# Patient Record
Sex: Male | Born: 1965 | Race: White | Marital: Married | Smoking: Never smoker
Health system: Northeastern US, Academic
[De-identification: ages and names within clinical notes are randomized; demographics above are authoritative.]

## PROBLEM LIST (undated history)

## (undated) DIAGNOSIS — F1910 Other psychoactive substance abuse, uncomplicated: Secondary | ICD-10-CM

## (undated) DIAGNOSIS — I10 Essential (primary) hypertension: Secondary | ICD-10-CM

## (undated) DIAGNOSIS — F32A Depression, unspecified: Secondary | ICD-10-CM

## (undated) DIAGNOSIS — F419 Anxiety disorder, unspecified: Secondary | ICD-10-CM

## (undated) HISTORY — DX: Anxiety disorder, unspecified: F41.9

## (undated) HISTORY — DX: Other psychoactive substance abuse, uncomplicated: F19.10

## (undated) HISTORY — DX: Depression, unspecified: F32.A

## (undated) HISTORY — DX: Essential (primary) hypertension: I10

## (undated) SURGERY — REPAIR, HERNIA, UMBILICAL
Anesthesia: General | Site: Abdomen

---
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
## 2006-10-02 DIAGNOSIS — F411 Generalized anxiety disorder: Secondary | ICD-10-CM | POA: Insufficient documentation

## 2006-10-02 DIAGNOSIS — F3289 Other specified depressive episodes: Secondary | ICD-10-CM | POA: Insufficient documentation

## 2006-10-02 DIAGNOSIS — I10 Essential (primary) hypertension: Secondary | ICD-10-CM | POA: Insufficient documentation

## 2006-10-02 DIAGNOSIS — E785 Hyperlipidemia, unspecified: Secondary | ICD-10-CM | POA: Insufficient documentation

## 2006-11-08 DIAGNOSIS — F909 Attention-deficit hyperactivity disorder, unspecified type: Secondary | ICD-10-CM | POA: Insufficient documentation

## 2006-11-08 DIAGNOSIS — H6690 Otitis media, unspecified, unspecified ear: Secondary | ICD-10-CM | POA: Insufficient documentation

## 2006-11-08 DIAGNOSIS — B079 Viral wart, unspecified: Secondary | ICD-10-CM | POA: Insufficient documentation

## 2009-08-17 ENCOUNTER — Ambulatory Visit
Admit: 2009-08-17 | Discharge: 2009-08-17 | Disposition: A | Discharge status: Home / Self Care | Payer: Self-pay | Source: Ambulatory Visit | Attending: Primary Care | Admitting: Primary Care

## 2009-08-17 LAB — CBC AND DIFFERENTIAL
Baso # K/uL: 0 THOU/uL (ref 0.0–0.1)
Basophil %: 0.6 % (ref 0.2–1.2)
Eos # K/uL: 0.6 THOU/uL — ABNORMAL HIGH (ref 0.0–0.5)
Eosinophil %: 9 % — ABNORMAL HIGH (ref 0.8–7.0)
Hematocrit: 45 % (ref 40–51)
Hemoglobin: 15.1 g/dL (ref 13.7–17.5)
Lymph # K/uL: 2.2 THOU/uL (ref 1.3–3.6)
Lymphocyte %: 33 % (ref 21.8–53.1)
MCV: 91 fL (ref 79–92)
Mono # K/uL: 0.6 THOU/uL (ref 0.3–0.8)
Monocyte %: 9 % (ref 5.3–12.2)
Neut # K/uL: 3.2 THOU/uL (ref 1.8–5.4)
Platelets: 268 THOU/uL (ref 150–330)
RBC: 5 MIL/uL (ref 4.6–6.1)
RDW: 12.5 % (ref 11.6–14.4)
Seg Neut %: 48.4 % (ref 34.0–67.9)
WBC: 6.7 THOU/uL (ref 4.2–9.1)

## 2009-08-17 LAB — LIPID PANEL
Chol/HDL Ratio: 5.1
Cholesterol: 221 mg/dL — AB
HDL: 43 mg/dL
LDL Calculated: 114 mg/dL
Non HDL Cholesterol: 178 mg/dL
Triglycerides: 319 mg/dL — AB

## 2009-08-17 LAB — BASIC METABOLIC PANEL
Anion Gap: 10 (ref 7–16)
CO2: 27 mmol/L (ref 20–28)
Calcium: 9 mg/dL (ref 9.0–10.3)
Chloride: 106 mmol/L (ref 96–108)
Creatinine: 1.21 mg/dL — ABNORMAL HIGH (ref 0.67–1.17)
GFR,Black: >59 *
GFR,Caucasian: >59 *
Glucose: 107 mg/dL — ABNORMAL HIGH (ref 74–106)
Lab: 18 mg/dL (ref 6–20)
Potassium: 4.1 mmol/L (ref 3.3–5.1)
Sodium: 143 mmol/L (ref 133–145)

## 2009-08-17 LAB — TSH: TSH: 1.83 u[IU]/mL (ref 0.27–4.20)

## 2009-08-18 ENCOUNTER — Encounter: Payer: Self-pay | Admitting: Primary Care

## 2009-09-05 ENCOUNTER — Ambulatory Visit: Payer: Self-pay | Admitting: Primary Care

## 2009-09-28 ENCOUNTER — Encounter: Payer: Self-pay | Admitting: Orthopedic Surgery

## 2009-10-19 ENCOUNTER — Ambulatory Visit: Payer: Self-pay | Admitting: Primary Care

## 2009-10-21 ENCOUNTER — Encounter: Payer: Self-pay | Admitting: Orthopedic Surgery

## 2009-11-03 ENCOUNTER — Ambulatory Visit: Payer: Self-pay | Admitting: Orthopedic Surgery

## 2009-11-07 NOTE — Progress Notes (Signed)
 HISTORY OF PRESENTING ILLNESS: He is now 44 years old and presents for  orthopaedic consultation for a problem with his left knee.  In August of  2010 he was running to first base playing softball.  He hyperextended his  left knee.  He thinks he felt a pop in the knee at that time.  He developed  an immediate effusion.  He iced it and was originally seen by a Scientist, water quality.  Radiographs were taken.  He was recommended to ice and rest.  Symptoms continued and now he presents for my consultation.    PAST MEDICAL HISTORY: Is otherwise significant for his right knee  osteoarthritis and a foot fracture.    PAST SURGICAL HISTORY: Negative.    MEDICATIONS:  He takes no medications.    ALLERGIES:   He has no known drug allergies.    REVIEW OF SYSTEMS: Otherwise negative.    PHYSICAL EXAMINATION: On physical examination of his left knee he has full  range of motion of his left knee.  He has no effusion. With range of motion  he does get a superior lateral popping in the knee.  It does appear to be  soft tissue mediated.   He has no tenderness over the quad tendon, patella,  patellar tendon, or tibial tuberosity. He has no medial or lateral patellar  facet tenderness.  He has no medial or lateral joint line tenderness. The  knee is stable to varus and valgus stress with a negative Lachman  examination and negative posterior drawer.  On circumduction maneuvers he  does have medial pain.  No lateral symptoms with circumduction.    RADIOGRAPHS: Review of his nonstanding AP and lateral radiographs dated  05/13/2009 are within normal limits with the exception of some mild  degenerative disease.    IMPRESSION: Possible meniscus tear of the left knee.    PLAN: I am going to send him for an MRI examination for further evaluation  for meniscal pathology.  I will see him back shortly following that study.                  Electronically Signed and Finalized  by  Lucy Antigua, MD  11/07/2009  10:40  ___________________________________________  Lucy Antigua, MD      DD:   11/03/2009  DT:   11/04/2009  6:07 A  HYQ/[id number]  [id number]    cc:   Ellsworth Lennox, MD

## 2009-11-09 ENCOUNTER — Ambulatory Visit: Payer: Self-pay | Admitting: Primary Care

## 2009-11-29 ENCOUNTER — Ambulatory Visit: Payer: Self-pay | Admitting: Primary Care

## 2009-12-27 ENCOUNTER — Ambulatory Visit: Payer: Self-pay | Admitting: Primary Care

## 2010-01-26 ENCOUNTER — Ambulatory Visit: Payer: Self-pay | Admitting: Primary Care

## 2010-02-02 ENCOUNTER — Encounter: Payer: Self-pay | Admitting: Gastroenterology

## 2010-02-02 NOTE — Progress Notes (Signed)
Reason For Visit   .  CHIEF COMPLAINT: Elevated cholesterol  HPI: In terms of elevated cholesterol for chest pain or shortness of   breath. No number of years. Moderate severity. Not getting better. High   blood pressure not losing significant weight. Increase glucose and the   patient does not check his blood sugars. Never took the metoprolol.   Drinking close to on a daily basis.    Medications reviewed and reconciled in the computer today.        PAST MEDICAL HISTORY: Anxiety        FAMILY HISTORY: Hypertension        SOCIAL HISTORY: Married, working, does not smoke does drink does not do   drugs     REVIEW OF SYSTEMS: No significant new GI symptoms, no significant   constitutional symptoms such as change in weight.     PHYSICAL EXAM: Vitals: As recorded  S1-S2, regular rhythm, no S3, no rub,   Lungs clear.  No masses,   tenderness, organomegaly, or bruits on abdominal exam.   no calf tenderness   or cords.  No edema.           ASSESS: Elevated cholesterol  Hypertension  Increased glucose        PLAN: Hold on metoprolol  [facility] for his alcohol     Follow-up later this year.  Vital Signs   Recorded by Hooper,Theresa on 26 Jan 2010 04:05 PM  BP:120/84,  LUE,  Sitting,   HR: 68 b/min,  L Radial, Regular,   Weight: 215.4 lb.  Signature   Electronically signed by: Ellsworth Lennox  M.D.; 02/02/2010 7:34 AM EST.

## 2010-02-23 ENCOUNTER — Ambulatory Visit: Payer: Self-pay

## 2010-03-28 ENCOUNTER — Ambulatory Visit: Payer: Self-pay | Admitting: Primary Care

## 2010-03-29 NOTE — Progress Notes (Signed)
Reason For Visit   .  CHIEF COMPLAINT: Sinusitis pharyngitis  HPI: In terms of sinusitis pharyngitis for about a week. Moderate severity.   Not getting better. Discolored drainage. No fever or chills.     Medications reviewed and reconciled in the computer today.        PAST MEDICAL HISTORY: Anxiety     REVIEW OF SYSTEMS:     No     constitutional symptoms such as fatigue.           PHYSICAL EXAM: Vitals: As recorded  S1-S2, regular rhythm, no S3, no rub,   Lungs clear.  No masses,   tenderness, organomegaly, or bruits on abdominal exam.  No calf tenderness   or cords.  No edema. Decreased sinus illumination for 1+ drainage. Throat not red. TMs   just a small amount of fluid              ASSESS: Sinusitis  Pharyngitis        PLAN: Flonase 2 q.d.  Zyrtec p.r.n.  Call if not better  Call if worsens     Follow-up: This year.  Vital Signs   Recorded by Hooper,Theresa on 28 Mar 2010 03:33 PM  BP:122/86,  LUE,  Sitting,   HR: 88 b/min,  L Radial, Regular,   Temp: 98.4 F,   Weight: 213.0 lb.  Signature   Electronically signed by: Ellsworth Lennox  M.D.; 03/29/2010 7:39 AM EST.

## 2010-05-04 ENCOUNTER — Ambulatory Visit: Payer: Self-pay | Admitting: Primary Care

## 2010-06-09 ENCOUNTER — Ambulatory Visit: Payer: Self-pay | Admitting: Primary Care

## 2010-07-20 ENCOUNTER — Ambulatory Visit: Payer: Self-pay | Admitting: Primary Care

## 2010-08-01 ENCOUNTER — Ambulatory Visit: Payer: Self-pay | Admitting: Primary Care

## 2010-08-01 NOTE — Progress Notes (Signed)
 Reason For Visit   .  CHIEF COMPLAINT: Arthritis     HPI: In terms of arthritis of his knee on previous x-rays. Knee pain and   swelling on occasion. No rebound through with alcohol counseling. Except   for one visit. Psychologically doing okay.    Medications reviewed and reconciled in the computer today.        PAST MEDICAL HISTORY: Hypertension elevated cholesterol        FAMILY HISTORY: Hypertension        SOCIAL HISTORY: Married, working, does not smoke but does drink but does   not do drugs        REVIEW OF SYSTEMS: No significant new GI symptoms, no significant   constitutional symptoms such as change in weight.     PHYSICAL EXAM: Vitals: As recorded  S1-S2, regular rhythm, no S3, no rub,   Lungs clear.  No masses,   tenderness, organomegaly, or bruits on abdominal exam.   no calf tenderness   or cords.  No edema. Right knee with tenderness and some instability           ASSESS: Knee pain  Arthritis        PLAN: Orthopedics  AA  Diet weight loss and exercise  X-ray  Check blood work  Call after blood work        Follow-up next year.  Vital Signs   Recorded by Hooper,Theresa on 01 Aug 2010 04:02 PM  BP:124/86,  LUE,  Sitting,   HR: 60 b/min,  L Radial, Regular,   Weight: 209 lb.  Signature   Electronically signed by: Ellsworth Lennox  M.D.; 08/01/2010 5:28 PM EST.

## 2010-10-10 NOTE — Miscellaneous (Unsigned)
 Continuity of Care Record  Created: todo  From: SUOZZI, ANTHONY  From:   From: [employer], EHR v10.2.7.53  To: Philis Kendall  Purpose: Patient Use;       Problems  Diagnosis: Anxiety (300.00)   Diagnosis: Attention Deficit Disorder Without Hyperactivity (314.01)   Diagnosis: Depression (311)   Diagnosis: Hyperlipidemia (272.4)   Diagnosis: Hypertension (401.9)   Diagnosis: Otitis Media (382.9)   Diagnosis: Warts (078.10)     Family History  Family history of Hypertension (V17.49)   Family history of Prostate Cancer (V16.42)     Alerts  Allergy - No Known Drug Allergy     Medications  Fluticasone Propionate 50 MCG/ACT Suspension; USE 2 SPRAYS IN EACH NOSTRIL   ONCE DAILY ; Rx   Metoprolol Succinate 50 MG Tablet Extended Release 24 Hour; TAKE 1 TABLET   DAILY ; Rx     Immunizations  Influenza   Influenza (Split)

## 2010-11-30 ENCOUNTER — Ambulatory Visit: Payer: Self-pay | Admitting: Primary Care

## 2011-01-08 ENCOUNTER — Encounter: Payer: Self-pay | Admitting: Primary Care

## 2011-01-08 ENCOUNTER — Ambulatory Visit: Payer: Self-pay | Admitting: Primary Care

## 2011-01-08 NOTE — Progress Notes (Signed)
 Reason For Visit   CHIEF COMPLAINT: Arthritis of the knee and knee pain  HPI: In terms of arthritis of the knee and knee pain the patient wishes to   hold off on seen orthopedics.  Hypertension elevated cholesterol not losing   weight.  Drink 8-10 beers a day and feels he cannot stop on his own.  Feels   withdrawal when he tries to stop.  Never did blood work Medications   reviewed and reconciled in the computer today.        PAST MEDICAL HISTORY: Possible ADD     REVIEW OF SYSTEMS: Just constitutional symptoms such as fatigue.           PHYSICAL EXAM: Vitals: As recorded S1-S2, regular rhythm, no S3, no rub,     Lungs clear.  No masses, tenderness, organomegaly, or bruits on abdominal   exam.  No calf tenderness or cords.  No edema.     EKG unchanged     ASSESS: Knee pain arthritis  Hypertension  Elevated cholesterol     PLAN: Metoprolol 50 daily  Warnings on medication  Check blood work  Garment/textile technologist dependency        Follow-up: Later this year with blood work.  Vital Signs   Recorded by [facility] on 08 Jan 2011 08:51 AM  BP:140/100,  LUE,  Sitting,   HR: 88 b/min,  L Radial, Regular,   Weight: 214.8 lb.  Signature   Electronically signed by: Ellsworth Lennox  M.D.; 01/08/2011 5:33 PM EST.

## 2011-01-29 ENCOUNTER — Ambulatory Visit: Payer: Self-pay | Admitting: Primary Care

## 2011-02-27 ENCOUNTER — Telehealth: Payer: Self-pay | Admitting: Primary Care

## 2011-02-27 NOTE — Telephone Encounter (Signed)
 Pt changed appt from 5/30 to 6/15

## 2011-02-28 ENCOUNTER — Ambulatory Visit: Payer: Self-pay | Admitting: Primary Care

## 2011-03-15 ENCOUNTER — Telehealth: Payer: Self-pay | Admitting: Primary Care

## 2011-03-15 NOTE — Telephone Encounter (Signed)
Pt cancelled appt for 6/15 through the service lm tcb with no call back all day.

## 2011-03-16 ENCOUNTER — Ambulatory Visit: Payer: Self-pay | Admitting: Primary Care

## 2012-02-04 ENCOUNTER — Ambulatory Visit: Payer: Self-pay | Admitting: Primary Care

## 2012-02-04 ENCOUNTER — Encounter: Payer: Self-pay | Admitting: Primary Care

## 2012-02-04 VITALS — BP 122/86 | HR 104 | Ht 68.0 in | Wt 210.2 lb

## 2012-02-04 DIAGNOSIS — E785 Hyperlipidemia, unspecified: Secondary | ICD-10-CM

## 2012-02-04 DIAGNOSIS — I10 Essential (primary) hypertension: Secondary | ICD-10-CM

## 2012-02-04 NOTE — Progress Notes (Signed)
CHIEF COMPLAINT: Hypertension    HPI: In terms of hypertension off and on for years.  Moderate severity.  Doing better in the past.  Under much stress from work.  Does not feel like he can work.  Feels it causes him to drink.  Not suicidal or homicidal.  Drink a 12 pack a day    Medications reviewed and reconciled in the computer today.      PAST MEDICAL HISTORY: EtOH abuse      REVIEW OF SYSTEMS:     No     constitutional symptoms such as fatigue.        PHYSICAL EXAM: Vitals: As recorded with repeat 138/96  S1-S2, regular rhythm, no S3, no rub,   Lungs clear.  No masses, tenderness, organomegaly, or bruits on abdominal exam.  No calf tenderness or cords.  No edema.          ASSESS: Hypertension      PLAN: Patient is going to try to find a new job  Patient will see psychiatry and substance abuse clinic  He will hold off on working to stabilize this raises blood pressure    Work excuse  Follow-up: Later this year 2 weeks

## 2012-02-04 NOTE — Patient Instructions (Addendum)
[facility] [phone] [address] 5/8 at 1:30

## 2012-02-06 ENCOUNTER — Ambulatory Visit
Admit: 2012-02-06 | Discharge: 2012-02-06 | Disposition: A | Discharge status: Home / Self Care | Payer: Self-pay | Source: Ambulatory Visit | Admitting: Psychiatry

## 2012-02-18 ENCOUNTER — Ambulatory Visit: Payer: Self-pay | Admitting: Primary Care

## 2012-02-18 ENCOUNTER — Encounter: Payer: Self-pay | Admitting: Primary Care

## 2012-02-18 VITALS — BP 136/90 | HR 80 | Ht 68.0 in | Wt 208.4 lb

## 2012-02-18 DIAGNOSIS — I10 Essential (primary) hypertension: Secondary | ICD-10-CM

## 2012-02-18 DIAGNOSIS — E785 Hyperlipidemia, unspecified: Secondary | ICD-10-CM

## 2012-02-18 NOTE — Progress Notes (Signed)
CHIEF COMPLAINT: Anxiety and hypertension    HPI: In terms of anxiety much better since she's not working.  Going to [facility] for counseling.  Seen them both for psych and for rehab.  12 week outpatient program 14 days last time for last 10 days sees been clean for 2 weeks from marijuana and alcohol is minimized.  Going for a job interview.  Feels he cannot work due to the stress when he returns to work he feels he puts himself at risk to start using again he feels the work environment is very stressful and is contributing to his problems.  He feels that is the reason for his blood pressure being elevated.  He feels must less anxious when not at work and it was the specific work.    Medications reviewed and reconciled in the computer today.      PAST MEDICAL HISTORY: Elevated cholesterol      REVIEW OF SYSTEMS:   Yes     constitutional symptoms such as fatigue.        PHYSICAL EXAM: Vitals: As recorded S1-S2, regular rhythm, no S3, no rub,   Lungs clear.  No masses, tenderness, organomegaly, or bruits on abdominal exam.  No calf tenderness or cords.  No edema.          ASSESS: Hypertension  Anxiety      PLAN: Patient feels that he cannot return to work he feels anxiety may push him over to using again and feels that would raise his blood pressure and cause undue anxiety if he went back to work.      Follow-up: 5-6 weeks to recheck blood pressure

## 2012-02-22 ENCOUNTER — Telehealth: Payer: Self-pay | Admitting: Primary Care

## 2012-02-22 NOTE — Telephone Encounter (Signed)
Spoke with pt, he is doing better. Resigned from his old job. Pt felt stress from old job was contributing significantly to his anxiety and he felt that he could not function there and that the anxiety from work was causing him to drink. Being away from work decreased his anxiety and he was able to seek treatment. Pt was adamant in that if he worked at his job that he would be anxious and drink.

## 2013-03-09 ENCOUNTER — Telehealth: Payer: Self-pay | Admitting: Primary Care

## 2013-03-09 DIAGNOSIS — E7800 Pure hypercholesterolemia, unspecified: Secondary | ICD-10-CM

## 2013-03-09 NOTE — Telephone Encounter (Signed)
In computer

## 2013-03-09 NOTE — Telephone Encounter (Signed)
Pt is aware.  

## 2013-03-09 NOTE — Telephone Encounter (Signed)
Pt scheduled phy appt for 9/4, do you want pt to get bw done prior?

## 2013-04-02 ENCOUNTER — Ambulatory Visit: Payer: Self-pay | Admitting: Primary Care

## 2013-04-02 ENCOUNTER — Encounter: Payer: Self-pay | Admitting: Primary Care

## 2013-04-02 VITALS — BP 128/84 | HR 78 | Ht 67.0 in | Wt 202.0 lb

## 2013-04-02 DIAGNOSIS — M549 Dorsalgia, unspecified: Secondary | ICD-10-CM

## 2013-04-02 MED ORDER — CYCLOBENZAPRINE HCL 10 MG PO TABS *I*
ORAL_TABLET | ORAL | Status: DC
Start: 2013-04-02 — End: 2013-06-08

## 2013-04-02 NOTE — Progress Notes (Signed)
CHIEF COMPLAINT:back pain    JXB:JYNW pain  Going on for 24 hours  Course getting better? Getting no better, feels tight  Associated symtoms no weakness or numbness  Moderate severity  Not down leg      Has pulled muscle in backs      Has cut way back on etoh atp and feels      Most recent notes,recent imaging,recent consults(media),recent bw reviewed   Vitals reviewed, medications reviewed and reconciled,problem list reviewed,allergies reviewed       PAST MEDICAL HISTORY:htn      FAMILY HISTORY: htn        SOCIAL HISTORY:married or with a significant other                                   Working                                 Does not smoke                                 Does not drink to excess or do drugs    ROS: no significant change in weight            No excessive fatigue            No new urinary symptoms            No new GI symptoms                     PHYSICAL EXAM: Vitals: including blood pressure as recorded  S1-S2, regular rhythm, no S3, no rub,   Lungs clear.  No masses, tenderness, organomegaly, or bruits on abdominal exam.   no calf tenderness or cords.  No edema. rom decreased secondary to pain, strength reflexes light touch equal bilaterally, tender lower back        ASSESS:muscle strain                          PLAN:Medications were reviewed and  changes were made    Risks of new medication reviewed     Follow up later this year with blood work prior  Continue to work on correct diet, optimal weight and exercise        Work excuse Friday and Sunday  No better call  advil short term only  Flexeril 10 mg 1/2 qhs prn, warnings driving and etoh      Current Outpatient Prescriptions   Medication Sig   . cyclobenzaprine (FLEXERIL) 10 MG tablet 1/2 tab po qhs prn     No current facility-administered medications for this visit.

## 2013-05-19 ENCOUNTER — Telehealth: Payer: Self-pay | Admitting: Primary Care

## 2013-05-19 NOTE — Telephone Encounter (Signed)
Pt changed phy appt from 9/4 to 10/16

## 2013-06-04 ENCOUNTER — Encounter: Payer: Self-pay | Admitting: Primary Care

## 2013-06-04 ENCOUNTER — Ambulatory Visit: Payer: Self-pay | Admitting: Primary Care

## 2013-06-08 ENCOUNTER — Encounter: Payer: Self-pay | Admitting: Gastroenterology

## 2013-06-08 ENCOUNTER — Ambulatory Visit: Payer: Self-pay | Admitting: Primary Care

## 2013-06-08 ENCOUNTER — Encounter: Payer: Self-pay | Admitting: Primary Care

## 2013-06-08 ENCOUNTER — Other Ambulatory Visit: Payer: Self-pay | Admitting: Primary Care

## 2013-06-08 VITALS — BP 140/90 | HR 107 | Ht 67.0 in | Wt 200.8 lb

## 2013-06-08 DIAGNOSIS — E785 Hyperlipidemia, unspecified: Secondary | ICD-10-CM

## 2013-06-08 DIAGNOSIS — I10 Essential (primary) hypertension: Secondary | ICD-10-CM

## 2013-06-08 MED ORDER — METOPROLOL SUCCINATE 50 MG PO TB24 *I*
50.0000 mg | ORAL_TABLET | Freq: Every day | ORAL | Status: DC
Start: 2013-06-08 — End: 2013-06-16

## 2013-06-08 MED ORDER — DIAZEPAM 5 MG PO TABS *I*
5.0000 mg | ORAL_TABLET | Freq: Every evening | ORAL | Status: DC | PRN
Start: 2013-06-08 — End: 2013-08-21

## 2013-06-08 NOTE — Patient Instructions (Signed)
[facility]- [phone] ask for rick

## 2013-06-08 NOTE — Progress Notes (Signed)
CHIEF COMPLAINT:htn    WGN:FAOZHYQMVHQI duration for greater than a year  Severity is moderate  Course is staying the same, not worsening  Associated no recent chf or known cardiomyopathy    Recently had to put down dog,   Drinking and anxious    Yesterday went to urgent care because was feeling anxious, tired , did not drink last night, ten beers every night      Tachycardic and anxious, has been that way in the past    Insomnia and feels may be withdrawing slightly        Elevated cholesterol duration for greater than a year  Severity is moderate not improving  Course is staying the same  Associated no recent heart attack or angina            Back pain resolved          Most recent notes,recent imaging,recent consults(media),recent bw reviewed   Vitals reviewed, medications reviewed and reconciled,problem list reviewed,allergies reviewed       PAST MEDICAL HISTORY:anxiety      FAMILY HISTORY: htn        SOCIAL HISTORY:married or with a significant other                                   Working                                 Does not smoke                                 Does not drink to excess or do drugs                                Working , things well at home    ROS: no significant change in weight            No excessive fatigue            No new urinary symptoms            No new GI symptoms                     PHYSICAL EXAM: Vitals: including blood pressure as recorded  S1-S2, regular rhythm, no S3, no rub,   Lungs clear.  No masses, tenderness, organomegaly, or bruits on abdominal exam.   no calf tenderness or cords.  No edema.     Ekg: unchanged    ASSESS:htn  Tachycardia  Insomnia  Elevated cholesterol                          PLAN:Medications were reviewed and  changes were made  Metoprolol succinate 50 mg po every day see below  Risks of new medications reviewed     Follow up later this year with blood work prior  Continue to work on correct diet, optimal weight and exercise      Valium 5 mg  Po  qhs and no driving  Blood test in AM  [facility]    May need work excuse this week      Current Outpatient Prescriptions   Medication Sig   . metoprolol (TOPROL-XL) 50  MG 24 hr tablet Take 1 tablet (50 mg total) by mouth daily   Do not crush or chew. May be divided.   . diazepam (VALIUM) 5 MG tablet Take 1 tablet (5 mg total) by mouth nightly as needed for Anxiety   MDD 20 mg     No current facility-administered medications for this visit.

## 2013-06-10 ENCOUNTER — Ambulatory Visit
Admit: 2013-06-10 | Discharge: 2013-06-10 | Disposition: A | Discharge status: Home / Self Care | Payer: Self-pay | Source: Ambulatory Visit | Attending: Primary Care | Admitting: Primary Care

## 2013-06-10 ENCOUNTER — Telehealth: Payer: Self-pay | Admitting: Primary Care

## 2013-06-10 DIAGNOSIS — I10 Essential (primary) hypertension: Secondary | ICD-10-CM

## 2013-06-10 DIAGNOSIS — E7800 Pure hypercholesterolemia, unspecified: Secondary | ICD-10-CM

## 2013-06-10 LAB — CBC AND DIFFERENTIAL
Baso # K/uL: 0.1 10*3/uL (ref 0.0–0.1)
Basophil %: 0.8 % (ref 0.2–1.2)
Eos # K/uL: 0.6 10*3/uL — ABNORMAL HIGH (ref 0.0–0.5)
Eosinophil %: 7.9 % — ABNORMAL HIGH (ref 0.8–7.0)
Hematocrit: 46 % (ref 40–51)
Hemoglobin: 15.2 g/dL (ref 13.7–17.5)
Lymph # K/uL: 2.1 10*3/uL (ref 1.3–3.6)
Lymphocyte %: 27.6 % (ref 21.8–53.1)
MCV: 91 fL (ref 79–92)
Mono # K/uL: 0.7 10*3/uL (ref 0.3–0.8)
Monocyte %: 9.4 % (ref 5.3–12.2)
Neut # K/uL: 4.2 10*3/uL (ref 1.8–5.4)
Platelets: 270 10*3/uL (ref 150–330)
RBC: 5 MIL/uL (ref 4.6–6.1)
RDW: 13.1 % (ref 11.6–14.4)
Seg Neut %: 54.3 % (ref 34.0–67.9)
WBC: 7.6 10*3/uL (ref 4.2–9.1)

## 2013-06-10 LAB — LIPID PANEL
Chol/HDL Ratio: 4.4
Cholesterol: 246 mg/dL — AB
HDL: 56 mg/dL
LDL Calculated: 137 mg/dL
Non HDL Cholesterol: 190 mg/dL
Triglycerides: 265 mg/dL — AB

## 2013-06-10 LAB — COMPREHENSIVE METABOLIC PANEL
ALT: 37 U/L (ref 0–50)
AST: 24 U/L (ref 0–50)
Albumin: 4.6 g/dL (ref 3.5–5.2)
Alk Phos: 53 U/L (ref 40–130)
Anion Gap: 10 (ref 7–16)
Bilirubin,Total: 0.6 mg/dL (ref 0.0–1.2)
CO2: 27 mmol/L (ref 20–28)
Calcium: 9.5 mg/dL (ref 8.6–10.2)
Chloride: 102 mmol/L (ref 96–108)
Creatinine: 1.06 mg/dL (ref 0.67–1.17)
GFR,Black: 97 *
GFR,Caucasian: 84 *
Glucose: 113 mg/dL — ABNORMAL HIGH (ref 60–99)
Lab: 17 mg/dL (ref 6–20)
Potassium: 4 mmol/L (ref 3.3–5.1)
Sodium: 139 mmol/L (ref 133–145)
Total Protein: 7.1 g/dL (ref 6.3–7.7)

## 2013-06-10 LAB — T4, FREE: Free T4: 1.4 ng/dL (ref 0.9–1.7)

## 2013-06-10 LAB — TSH: TSH: 1 u[IU]/mL (ref 0.27–4.20)

## 2013-06-10 NOTE — Telephone Encounter (Signed)
Pt stated he had received phone call from the office last night, attempted to call back and got the answering service. I did not see a message to relay to him. He wanted to let you know that he is doing ok on the new medication, and was on his way to the lab as we were speaking. He said he thought you were trying to contact him to see how he was feeling but if there's anything else you'd like to discuss feel free to call him at any time.

## 2013-06-11 ENCOUNTER — Telehealth: Payer: Self-pay | Admitting: Primary Care

## 2013-06-11 NOTE — Telephone Encounter (Signed)
Called and left another message

## 2013-06-11 NOTE — Telephone Encounter (Signed)
Tried calling and left message

## 2013-06-11 NOTE — Telephone Encounter (Signed)
Spoke with pt,he is feeling well, not using valium, is aware of mdd of one tablet, trying to connect with [facility], not drinking or using and reviewed bw and weight loss

## 2013-06-15 ENCOUNTER — Telehealth: Payer: Self-pay | Admitting: Primary Care

## 2013-06-15 ENCOUNTER — Encounter: Payer: Self-pay | Admitting: Gastroenterology

## 2013-06-15 NOTE — Telephone Encounter (Signed)
Pt called and said that he has appt on 9/19 with [facility]

## 2013-06-16 ENCOUNTER — Encounter: Payer: Self-pay | Admitting: Primary Care

## 2013-06-16 ENCOUNTER — Telehealth: Payer: Self-pay | Admitting: Primary Care

## 2013-06-16 NOTE — Telephone Encounter (Signed)
Pt was given message. Med list updated.

## 2013-06-16 NOTE — Telephone Encounter (Signed)
Med list updatedPt informed

## 2013-06-16 NOTE — Telephone Encounter (Signed)
Pt would like to stop metoprolol. He states it makes him feel "loopy". He has not had a drink in 7 days and states he is feeling better. Ok to stop med?

## 2013-06-16 NOTE — Telephone Encounter (Signed)
No, cut down to 1/2 a day and up date med list and have him continue that low dose until he sees me, if having issues than ov prior

## 2013-07-06 ENCOUNTER — Telehealth: Payer: Self-pay | Admitting: Primary Care

## 2013-07-06 NOTE — Telephone Encounter (Signed)
Pt states he has been having anxiety attacks. He stopped taking metoprolol on sept 27. He has lost ten pounds. He is drinking up to four beers a night. Should pt restart metolprolol,

## 2013-07-06 NOTE — Telephone Encounter (Signed)
1/2 tablet every day and what is his rx for his drinking

## 2013-07-06 NOTE — Telephone Encounter (Signed)
Pt was given message.he cancelled his consultation with [facility] d/t work but states he will f/u later with them.

## 2013-07-16 ENCOUNTER — Ambulatory Visit: Payer: Self-pay | Admitting: Primary Care

## 2013-07-16 ENCOUNTER — Encounter: Payer: Self-pay | Admitting: Primary Care

## 2013-07-16 VITALS — BP 136/94 | HR 96 | Ht 67.0 in | Wt 193.6 lb

## 2013-07-16 DIAGNOSIS — I10 Essential (primary) hypertension: Secondary | ICD-10-CM

## 2013-07-16 DIAGNOSIS — E785 Hyperlipidemia, unspecified: Secondary | ICD-10-CM

## 2013-07-16 NOTE — H&P (Signed)
Chief complaint:htn    ZOX:WRUEAVWUJWJX duration for greater than a year  Severity is moderate  Course is staying the same, not worsening  Associated no recent chf or known cardiomyopathy    Elevated cholesterol duration for greater than a year  Severity is moderate not improving  Course is staying the same  Associated no recent heart attack or angina      Lost seven pounds on purpose , exercise and watching diet, volitional    Anxious, saw [facility], wed return to office,     Never restarted metoprolol having attacks,         Most recent notes,recent imaging,recent consults(media),recent bw reviewed   Vitals reviewed, medications reviewed and reconciled,problem list reviewed,allergies reviewed     Past medical history: anxiety, htn, hyperlipidemia, depression, ADD, warts        Family history: Diabetes no       hypertension yes        coronary artery disease no           cancer yes breast    Social history:  Married or with a significant other, working, does  exercise regularly, occasionally watches diet, does not smoke, does drink to excess, does not do drugs now, never did intravenous drugs, is  in a monogamous relationship, sees the dentist regularly, and wears seatbelts.    Review of systems: No chest pain or dyspnea, no blood or black stools, no urinary symptoms that are new, no stroke or seizure, no constitutional symptoms, no polyuria or polydipsia, no problems with the blood or lymph glands, no significantly new problems with the eyes ears nose throat or skin, no new orthopedic problems, psychologically stable. Medications reviewed and reconciled in computer today.  Most recent blood work reviewed   Physical exam: Weight:   Vitals including blood pressure as recorded   Murmur   no       regular rhythm  S1-S2 no S3 no rub PMI nondisplaced.  Jugular venous pressure approximately 7 cm.  No new edema , no calf tenderness, or cords.  Lungs clear to auscultation.  No masses, tenderness, organomegaly or  bruits and normal bowel sounds.  Cranial nerves, pronator drift, finger-to-nose, light touch, reflexes all equal normal bilaterally.  Acuity, extraocular movements light reflex, ophthalmologic exam all equal and unchanged from previous and without significant abnormality.  Hearing equal bilaterally TMs normal and canals unremarkable.  Nose unremarkable lymph gland cervical axillary supraclavicular groin all unremarkable.  Skin unremarkable, feet unremarkable and no hernias.  Pulses equal.  No significant bruits.  thyroid normal. Mouth and throat unremarkable.   BJY:NWGNFAOZH and scrotum were unremarkable, there were no hernias, prostate was normal for age without any nodules suggestive of cancer, rectal was unremarkable. Breasts unremarkable.           EKG was reviewed and was without significant change from prior.    ASSESS:htn  Etoh/  Anxiety  Add  cholesterol          PLAN:Medications were reviewed and  changes were made        Risks of new meds reviewed     TSE Known    Follow up later this year with blood work prior   Continue to work on correct diet, optimal weight and exercise      going to [place] next week  Risk 3.5% hold on cholesterol meds  Metoprolol xl 50 qd       Follow up with psych for substance and anxiety and ADD  Not using valium                      Just saw derm, sees once a year  Follow up blood test prior to ov    tdap right [place]    Knee pain    Current Outpatient Prescriptions   Medication Sig   . diazepam (VALIUM) 5 MG tablet Take 1 tablet (5 mg total) by mouth nightly as needed for Anxiety   MDD 20 mg   . metoprolol (TOPROL-XL) 50 MG 24 hr tablet 50 mg   Take  tab daily     No current facility-administered medications for this visit.

## 2013-07-16 NOTE — Patient Instructions (Signed)
Tdap early November  Repeat blood test a week prior to next ov

## 2013-08-06 ENCOUNTER — Ambulatory Visit: Payer: Self-pay

## 2013-08-18 ENCOUNTER — Telehealth: Payer: Self-pay | Admitting: Primary Care

## 2013-08-18 NOTE — Telephone Encounter (Signed)
L/m for pt to make ov and script will not be renewed until after he sees doctor.

## 2013-08-18 NOTE — Telephone Encounter (Signed)
Pt states he still has anxiety and has been taking valium 5 mg 1/2 tab to help him sleep at night. Is this ok to refill? [facility] [address]

## 2013-08-18 NOTE — Telephone Encounter (Signed)
No, he needs ov to renew that and find out what his follow up with psych is

## 2013-08-20 ENCOUNTER — Ambulatory Visit: Payer: Self-pay | Admitting: Primary Care

## 2013-08-21 ENCOUNTER — Encounter: Payer: Self-pay | Admitting: Primary Care

## 2013-08-21 ENCOUNTER — Telehealth: Payer: Self-pay | Admitting: Primary Care

## 2013-08-21 ENCOUNTER — Ambulatory Visit: Payer: Self-pay | Admitting: Primary Care

## 2013-08-21 VITALS — BP 134/76 | HR 88 | Ht 68.0 in | Wt 189.2 lb

## 2013-08-21 DIAGNOSIS — E785 Hyperlipidemia, unspecified: Secondary | ICD-10-CM

## 2013-08-21 DIAGNOSIS — Z23 Encounter for immunization: Secondary | ICD-10-CM

## 2013-08-21 DIAGNOSIS — I10 Essential (primary) hypertension: Secondary | ICD-10-CM

## 2013-08-21 MED ORDER — DIAZEPAM 5 MG PO TABS *I*
5.0000 mg | ORAL_TABLET | Freq: Every evening | ORAL | Status: DC | PRN
Start: 2013-08-21 — End: 2014-04-01

## 2013-08-21 MED ORDER — PAROXETINE HCL 20 MG PO TABS *I*
20.0000 mg | ORAL_TABLET | Freq: Every morning | ORAL | Status: DC
Start: 2013-08-21 — End: 2013-08-21

## 2013-08-21 MED ORDER — PAROXETINE HCL 10 MG PO TABS *I*
10.0000 mg | ORAL_TABLET | Freq: Every morning | ORAL | Status: DC
Start: 2013-08-21 — End: 2014-03-05

## 2013-08-21 NOTE — Telephone Encounter (Signed)
Script cancelled. 

## 2013-08-21 NOTE — Telephone Encounter (Signed)
See other note

## 2013-08-21 NOTE — Telephone Encounter (Signed)
Please call [facility] [address] and cancel the paxil 20 mg

## 2013-08-21 NOTE — Progress Notes (Signed)
CHIEF COMPLAINT:insomnia    HPI:          Insomnia sleeping poorly and  Valium working well  Pt going to [facility] and was felt to have GAD and does get anxiety attacks, going to [facility] for both etoh and cannibus and anxiety. Has taken no etoh and other drugs since last seen. Seeing psychiatrist on dec 23rd      Most recent notes,recent imaging,recent consults(media),recent bw reviewed   Vitals reviewed, medications reviewed and reconciled,problem list reviewed,allergies reviewed       PAST MEDICAL HISTORY:hyperchol      FAMILY HISTORY: htn        SOCIAL HISTORY:married or with a significant other                                   Working                                 Does not smoke                                 Does not drink to excess or do drugs    ROS: no significant change in weight            No excessive fatigue            No new urinary symptoms            No new GI symptoms                     PHYSICAL EXAM: Vitals: including blood pressure as recorded  S1-S2, regular rhythm, no S3, no rub,   Lungs clear.  No masses, tenderness, organomegaly, or bruits on abdominal exam.   no calf tenderness or cords.  No edema.         ASSESS:insomnia                        PLAN:Medications were reviewed and no changes were made      Continue metoprolol at same dose, benefits outweigh risks, rest of meds the same.  Follow up next year with blood work prior  Continue to work on correct diet, optimal weight and exercise        paxil 10 mg qd  Valium 5 mg every day prn, #10, knows not to do it not daily, pt aware, driving    metoprol xl 50 mg 1/2 every day    Derm in may 2015  tdap    bw  Prior to next visit  paxil 10 mg every day  warnings    Current Outpatient Prescriptions   Medication Sig   . diazepam (VALIUM) 5 MG tablet Take 1 tablet (5 mg total) by mouth nightly as needed for Anxiety   Max daily dose: 5 mg   . PARoxetine (PAXIL) 10 MG tablet Take 1 tablet (10 mg total) by mouth every morning   . metoprolol  (TOPROL-XL) 50 MG 24 hr tablet 50 mg   Take  tab daily     No current facility-administered medications for this visit.

## 2013-09-03 ENCOUNTER — Ambulatory Visit: Payer: Self-pay | Admitting: Primary Care

## 2013-10-08 ENCOUNTER — Ambulatory Visit
Admit: 2013-10-08 | Discharge: 2013-10-08 | Disposition: A | Discharge status: Home / Self Care | Payer: Self-pay | Source: Ambulatory Visit | Admitting: Psychiatry

## 2013-10-12 ENCOUNTER — Encounter: Payer: Self-pay | Admitting: Primary Care

## 2013-10-15 ENCOUNTER — Ambulatory Visit
Admit: 2013-10-15 | Discharge: 2013-10-15 | Disposition: A | Discharge status: Home / Self Care | Payer: Self-pay | Source: Ambulatory Visit | Admitting: Psychiatry

## 2013-10-16 ENCOUNTER — Ambulatory Visit
Admit: 2013-10-16 | Discharge: 2013-10-16 | Disposition: A | Discharge status: Home / Self Care | Payer: Self-pay | Source: Ambulatory Visit | Attending: Primary Care | Admitting: Primary Care

## 2013-10-16 DIAGNOSIS — I10 Essential (primary) hypertension: Secondary | ICD-10-CM

## 2013-10-16 DIAGNOSIS — E785 Hyperlipidemia, unspecified: Secondary | ICD-10-CM

## 2013-10-16 LAB — BASIC METABOLIC PANEL
Anion Gap: 14 (ref 7–16)
CO2: 27 mmol/L (ref 20–28)
Calcium: 9.2 mg/dL (ref 8.6–10.2)
Chloride: 103 mmol/L (ref 96–108)
Creatinine: 1.05 mg/dL (ref 0.67–1.17)
GFR,Black: 98 *
GFR,Caucasian: 85 *
Glucose: 92 mg/dL (ref 60–99)
Lab: 19 mg/dL (ref 6–20)
Potassium: 4.6 mmol/L (ref 3.3–5.1)
Sodium: 144 mmol/L (ref 133–145)

## 2013-10-16 LAB — LIPID PANEL
Chol/HDL Ratio: 3.8
Cholesterol: 196 mg/dL
HDL: 52 mg/dL
LDL Calculated: 127 mg/dL
Non HDL Cholesterol: 144 mg/dL
Triglycerides: 85 mg/dL

## 2013-10-16 LAB — TSH: TSH: 1.18 u[IU]/mL (ref 0.27–4.20)

## 2013-10-16 LAB — CBC AND DIFFERENTIAL
Baso # K/uL: 0.1 10*3/uL (ref 0.0–0.1)
Basophil %: 1.1 % (ref 0.2–1.2)
Eos # K/uL: 0.5 10*3/uL (ref 0.0–0.5)
Eosinophil %: 10.5 % — ABNORMAL HIGH (ref 0.8–7.0)
Hematocrit: 45 % (ref 40–51)
Hemoglobin: 14.6 g/dL (ref 13.7–17.5)
Lymph # K/uL: 1.6 10*3/uL (ref 1.3–3.6)
Lymphocyte %: 33.8 % (ref 21.8–53.1)
MCH: 30 pg/cell (ref 26–32)
MCHC: 32 g/dL (ref 32–37)
MCV: 92 fL (ref 79–92)
Mono # K/uL: 0.5 10*3/uL (ref 0.3–0.8)
Monocyte %: 9.9 % (ref 5.3–12.2)
Neut # K/uL: 2.1 10*3/uL (ref 1.8–5.4)
Platelets: 297 10*3/uL (ref 150–330)
RBC: 4.9 MIL/uL (ref 4.6–6.1)
RDW: 12.5 % (ref 11.6–14.4)
Seg Neut %: 44.7 % (ref 34.0–67.9)
WBC: 4.7 10*3/uL (ref 4.2–9.1)

## 2013-10-19 ENCOUNTER — Ambulatory Visit: Payer: Self-pay | Admitting: Primary Care

## 2013-10-19 ENCOUNTER — Encounter: Payer: Self-pay | Admitting: Primary Care

## 2013-10-19 VITALS — BP 114/86 | HR 68 | Ht 67.0 in | Wt 184.2 lb

## 2013-10-19 DIAGNOSIS — E785 Hyperlipidemia, unspecified: Secondary | ICD-10-CM

## 2013-10-19 DIAGNOSIS — I10 Essential (primary) hypertension: Secondary | ICD-10-CM

## 2013-10-19 NOTE — Progress Notes (Signed)
CHIEF COMPLAINT:htn    BJY:[id number]:Hypertension duration for greater than a year  Severity is moderate  Course is staying the same, not worsening  Associated no recent chf or known cardiomyopathy    Elevated cholesterol duration for greater than a year  Severity is moderate not improving  Course is staying the same  Associated no recent heart attack or angina    Insomnia, had recurrent episodes now gone, takes valium very rarely    Still on metoprolol  Dn without any bleedings  Got flu shot    Went to psych and prescribed zoloft and pt has not taken yet and never took paxil    Currently has not drank since TG or pot for even longer    Most recent notes,recent imaging,recent consults(media),recent bw reviewed   Vitals reviewed, medications reviewed and reconciled,problem list reviewed,allergies reviewed   Going to group meetings    PAST MEDICAL HISTORY:add      FAMILY HISTORY: htn        SOCIAL HISTORY:married or with a significant other                                   Working                                 Does not smoke                                 Does not drink to excess or do drugs    ROS: no significant change in weight            No excessive fatigue            No new urinary symptoms            No new GI symptoms                     PHYSICAL EXAM: Vitals: including blood pressure as recorded  S1-S2, regular rhythm, no S3, no rub,   Lungs clear.  No masses, tenderness, organomegaly, or bruits on abdominal exam.   no calf tenderness or cords.  No edema.         ASSESS:htn  hyperchol  Insomnia  dn                        PLAN:Medications were reviewed and no changes were made    Risks of new medication reviewed   Continue metoprolol at same dose, benefits outweigh risks, rest of meds the same.  Follow up next year with blood work prior  Continue to work on [employer] diet, optimal weight and exercise              Derm in may 2015 for dn and check       Continue metoprolol xl 50 mg 1/2 a day    Current Outpatient  Prescriptions   Medication Sig    diazepam (VALIUM) 5 MG tablet Take 1 tablet (5 mg total) by mouth nightly as needed for Anxiety   Max daily dose: 5 mg    PARoxetine (PAXIL) 10 MG tablet Take 1 tablet (10 mg total) by mouth every morning    metoprolol (TOPROL-XL) 50 MG 24 hr tablet 50 mg   Take  tab  daily     No current facility-administered medications for this visit.

## 2013-12-08 ENCOUNTER — Ambulatory Visit
Admit: 2013-12-08 | Discharge: 2013-12-08 | Disposition: A | Discharge status: Home / Self Care | Payer: Self-pay | Source: Ambulatory Visit | Admitting: Psychiatry

## 2013-12-17 ENCOUNTER — Ambulatory Visit: Payer: Self-pay | Admitting: Neurology

## 2013-12-17 ENCOUNTER — Encounter: Payer: Self-pay | Admitting: Neurology

## 2013-12-17 VITALS — BP 124/78 | HR 91 | Resp 16 | Ht 67.0 in | Wt 184.0 lb

## 2013-12-17 DIAGNOSIS — H6592 Unspecified nonsuppurative otitis media, left ear: Secondary | ICD-10-CM

## 2013-12-27 NOTE — Progress Notes (Signed)
SUBJECTIVE:  One-week history of left ear fullness.  Hearing muffled.  Crackling sensation.  Denies otalgia, tinnitus, vertigo.  Denies URI/rhinosinusitis.    Continuing to endorse anxiety and depression.  [facility]  SSRI has been recommended - never started.    Takes diazepam when needed.    Medication regime reconciled.    OBJECTIVE:  Blood pressure 124/78, pulse 91, resp. rate 16, height 1.702 m (5\' 7" ), weight 83.462 kg (184 lb), SpO2 98 %.  appears comfortable.  Conversing in normal tone.  Right ear canal and TM clear.  No erythema or effusion.  Left ear canal clear.  No erythema.  TMs pearly gray.  Serous effusion noted.  No facial tenderness.  O/P noninjected.  Neck supple.  Lungs clear.    IMPRESSION/PLAN:  Serous otitis media.  Reassured that there is no evidence of acute bacterial infection on exam.  Expectorant, fluticasone nasal spray, humidified air, fluids.  Followup with Dr. Foster SimpsonSuozzi as scheduled.

## 2014-02-02 ENCOUNTER — Telehealth: Payer: Self-pay | Admitting: Primary Care

## 2014-02-02 ENCOUNTER — Ambulatory Visit: Payer: Self-pay | Admitting: Primary Care

## 2014-02-02 NOTE — Telephone Encounter (Signed)
Pt changed appt from 5/14 to 5/22

## 2014-02-11 ENCOUNTER — Ambulatory Visit: Payer: Self-pay | Admitting: Primary Care

## 2014-02-19 ENCOUNTER — Ambulatory Visit: Payer: Self-pay | Admitting: Primary Care

## 2014-03-02 ENCOUNTER — Ambulatory Visit: Payer: Self-pay | Admitting: Primary Care

## 2014-03-02 VITALS — BP 116/84 | HR 64 | Ht 67.0 in | Wt 195.6 lb

## 2014-03-02 DIAGNOSIS — I10 Essential (primary) hypertension: Secondary | ICD-10-CM

## 2014-03-02 DIAGNOSIS — F419 Anxiety disorder, unspecified: Secondary | ICD-10-CM

## 2014-03-02 DIAGNOSIS — M79673 Pain in unspecified foot: Secondary | ICD-10-CM

## 2014-03-02 DIAGNOSIS — M79671 Pain in right foot: Secondary | ICD-10-CM

## 2014-03-02 DIAGNOSIS — E7800 Pure hypercholesterolemia, unspecified: Secondary | ICD-10-CM

## 2014-03-02 NOTE — Progress Notes (Signed)
CHIEF COMPLAINT:htn    PNP:[id number] duration for greater than a year  Severity is moderate  Course is staying the same, not worsening  Associated no recent chest pain or kidney disease    Elevated cholesterol duration for greater than a year  Severity is moderate not improving  Course is staying the same  Associated no recent periph vascular disease or stroke      Anxiety, sleeping ok now, does not feel panicky, episodes   Stressing about life  Foot pain last few weeks not getting better    Has gained weight   No longer Dr. Abundio Miu, not taking medications, went 12 weeks, drink occasionally, feeling anxious, is concerned, tried zoloft and never took paxil        Most recent notes,recent imaging,recent consults(media),recent bw reviewed   Vitals reviewed, medications reviewed and reconciled,problem list reviewed,allergies reviewed       PAST MEDICAL HISTORY:substance abuse      FAMILY HISTORY: htn        SOCIAL HISTORY:married or with a significant other                                 Working                                 Does not smoke                                 Does not drink to excess or do drugs    ROS: nosignificant change in weight            No excessive fatigue            No new urinary symptoms            No new GI symptoms                     PHYSICAL EXAM: Vitals: including blood pressure as recorded  S1-S2, regular rhythm, no S3, no rub,   Lungs clear.  No masses, tenderness, organomegaly, or bruits on abdominal exam.   no calf tenderness or cords.  No edema.minimal foot tenderness        ASSESS:htn  hyperchol  insomnia    Foot pain                      PLAN:Medications were reviewed andnochanges were made    Risks of new medication reviewed  Continue metoprolol at same dose, benefits outweigh risks, rest of meds the same.  Follow up this year with blood work prior  Continue to work on [employer], optimal weight and exercise  Call with nbame of potential med, will probably start paxil at  follow up    Dermatology on Monday    Has valium left at home    Right foot pain with sensation on foot, offered pt podiatry, he would rather just have xray         Current Outpatient Prescriptions   Medication Sig    metoprolol (TOPROL-XL) 50 MG 24 hr tablet Take 50 mg by mouth daily   1/2 tablet a day    diazepam (VALIUM) 5 MG tablet Take 1 tablet (5 mg total) by mouth nightly as needed for Anxiety   Max daily dose: 5  mg    PARoxetine (PAXIL) 10 MG tablet Take 1 tablet (10 mg total) by mouth every morning     No current facility-administered medications for this visit.

## 2014-03-05 ENCOUNTER — Telehealth: Payer: Self-pay | Admitting: Primary Care

## 2014-03-05 MED ORDER — PAROXETINE HCL 20 MG PO TABS *I*
20.0000 mg | ORAL_TABLET | Freq: Every morning | ORAL | Status: DC
Start: 2014-03-05 — End: 2014-10-22

## 2014-03-05 NOTE — Telephone Encounter (Signed)
Reviewed risk benefits, start paxil 20 mg every day, may need valium initially, warnings on meds, follow up in 6-8 weeks

## 2014-03-05 NOTE — Telephone Encounter (Signed)
Pt. Confirming buspar and for anxiety medication he would like to know opinion on lexapro versus paxil for him

## 2014-04-01 ENCOUNTER — Encounter: Payer: Self-pay | Admitting: Primary Care

## 2014-04-01 ENCOUNTER — Ambulatory Visit: Payer: Self-pay | Admitting: Primary Care

## 2014-04-01 ENCOUNTER — Telehealth: Payer: Self-pay | Admitting: Primary Care

## 2014-04-01 VITALS — BP 132/68 | HR 78 | Ht 67.0 in | Wt 196.0 lb

## 2014-04-01 DIAGNOSIS — I10 Essential (primary) hypertension: Secondary | ICD-10-CM

## 2014-04-01 DIAGNOSIS — F419 Anxiety disorder, unspecified: Secondary | ICD-10-CM

## 2014-04-01 MED ORDER — DIAZEPAM 5 MG PO TABS *I*
5.0000 mg | ORAL_TABLET | Freq: Every evening | ORAL | Status: DC | PRN
Start: 2014-04-01 — End: 2015-02-24

## 2014-04-01 NOTE — Progress Notes (Signed)
CHIEF COMPLAINT:htn    JXB:JYNWGNFAOZHYHPI:Hypertension duration for greater than a year  Severity is moderate  Course is staying the same, not worsening  Associated no recent chest pain or kidney disease              Anxiety generalized  Has not started paxil  Saw dermatology in June, no lesions were biopsied  Foot doing better, xray negative      Most recent notes,recent imaging,recent consults(media),recent bw reviewed   Vitals reviewed, medications reviewed and reconciled,problem list reviewed,allergies reviewed       PAST MEDICAL HISTORY:etoh abuse      FAMILY HISTORY: htn        SOCIAL HISTORY:married or with a significant other                                   Working                                 Does not smoke                                 Does not drink to excess or do drugs    ROS: no significant change in weight            No excessive fatigue            No new urinary symptoms            No new GI symptoms                     PHYSICAL EXAM: Vitals: including blood pressure as recorded  S1-S2, regular rhythm, no S3, no rub,   Lungs clear.  No masses, tenderness, organomegaly, or bruits on abdominal exam.   no calf tenderness or cords.  No edema.         ASSESS:htn         Anxiety generalized and difficulty sleeping with some elements of depression and adhd               PLAN:Medications were reviewed and  changes were made    Risks of new medication reviewed   Continue metop[rolol at same dose, benefits outweigh risks, rest of meds the same.  Follow up this year with blood work prior  Continue to work on correct diet, optimal weight and exercise    Follow up with derm in 6-12 months, Dr . Hyacinth Meekermiller    Stop etoh, drinking four  A day will stop  Valium 5 mg  Warning on driving and etoh, [address] [facility]  3-4 weeks folow up    Start paxil, never started it         Current Outpatient Prescriptions   Medication Sig    PARoxetine (PAXIL) 20 MG tablet Take 1 tablet (20 mg total) by mouth every morning    metoprolol  (TOPROL-XL) 50 MG 24 hr tablet Take 50 mg by mouth daily   1/2 tablet a day    diazepam (VALIUM) 5 MG tablet Take 1 tablet (5 mg total) by mouth nightly as needed for Anxiety   Max daily dose: 5 mg     No current facility-administered medications for this visit.

## 2014-04-01 NOTE — Telephone Encounter (Signed)
done

## 2014-04-01 NOTE — Telephone Encounter (Signed)
Please call Dr. Hyacinth MeekerMiller [facility] to send me most recent notes on pt

## 2014-04-14 ENCOUNTER — Telehealth: Payer: Self-pay

## 2014-04-14 NOTE — Telephone Encounter (Signed)
Patient called to cx 7/20 appt and scheduled to 8/3.  Said he just started new medication and wanted to give it another two weeks before following up.

## 2014-04-19 ENCOUNTER — Ambulatory Visit: Payer: Self-pay | Admitting: Primary Care

## 2014-05-03 ENCOUNTER — Telehealth: Payer: Self-pay | Admitting: Primary Care

## 2014-05-03 ENCOUNTER — Ambulatory Visit: Payer: Self-pay | Admitting: Primary Care

## 2014-05-03 NOTE — Telephone Encounter (Signed)
Pt changed appt from 8/3 to 8/18

## 2014-05-17 ENCOUNTER — Telehealth: Payer: Self-pay | Admitting: Primary Care

## 2014-05-17 NOTE — Telephone Encounter (Signed)
Pt changed appt from 8/18 to 8/31

## 2014-05-18 ENCOUNTER — Ambulatory Visit: Payer: Self-pay | Admitting: Primary Care

## 2014-05-31 ENCOUNTER — Telehealth: Payer: Self-pay | Admitting: Primary Care

## 2014-05-31 ENCOUNTER — Ambulatory Visit: Payer: Self-pay | Admitting: Primary Care

## 2014-05-31 NOTE — Telephone Encounter (Signed)
Pt changed appt from 8/31 to 9/14. Pt had to put his cat down today and is not in the mood to come in.

## 2014-06-14 ENCOUNTER — Ambulatory Visit: Payer: Self-pay | Admitting: Primary Care

## 2014-06-14 ENCOUNTER — Encounter: Payer: Self-pay | Admitting: Primary Care

## 2014-06-14 VITALS — BP 132/88 | HR 90 | Ht 67.0 in | Wt 208.8 lb

## 2014-06-14 DIAGNOSIS — I10 Essential (primary) hypertension: Secondary | ICD-10-CM

## 2014-06-14 NOTE — Progress Notes (Signed)
CHIEF COMPLAINT:htn    ZOX:WRUEAVWUJWJX duration for greater than a year  Severity is moderate  Course is staying the same, not worsening  Associated no recent chest pain or kidney disease        Foot doing much better    Started on paxil, drinking a few drinks a night        Work going ok,         Most recent notes,recent imaging,recent consults(media),recent bw reviewed   Vitals reviewed, medications reviewed and reconciled,problem list reviewed,allergies reviewed       PAST MEDICAL HISTORY:hyperchol      FAMILY HISTORY: htn        SOCIAL HISTORY:married or with a significant other                                   Working                                 Does not smoke                                 Does not drink to excess or do drugs    ROS: no significant change in weight            No excessive fatigue            No new urinary symptoms            No new GI symptoms                     PHYSICAL EXAM: Vitals: including blood pressure as recorded  S1-S2, regular rhythm, no S3, no rub,   Lungs clear.  No masses, tenderness, organomegaly, or bruits on abdominal exam.   no calf tenderness or cords.  No edema. Tender left arm near elbow, no masses      ekg unremarkable  ASSESS:hypertension                          PLAN:Medications were reviewed and  changes were made    Risks of new medication reviewed   Continue metoprolol at same dose, benefits outweigh risks, rest of meds the same.  Follow up this year with blood work prior  Continue to work on [employer], optimal weight and exercise  Will stop drinking, warnings on valium use and driving and working, start paxil and call with issues      Derm  early spring but going back sooner for skin tags this month    bw through work in october    Current Outpatient Prescriptions   Medication Sig    diazepam (VALIUM) 5 MG tablet Take 1 tablet (5 mg total) by mouth nightly as needed for Anxiety   Max daily dose: 5 mg    PARoxetine (PAXIL) 20 MG tablet Take 1 tablet (20  mg total) by mouth every morning    metoprolol (TOPROL-XL) 50 MG 24 hr tablet Take 50 mg by mouth daily   1/2 tablet a day     No current facility-administered medications for this visit.

## 2014-07-01 ENCOUNTER — Other Ambulatory Visit: Payer: Self-pay | Admitting: Primary Care

## 2014-07-01 ENCOUNTER — Encounter: Payer: Self-pay | Admitting: Gastroenterology

## 2014-07-01 LAB — LIPID PANEL
Chol/HDL Ratio: 4.2
Cholesterol: 223
HDL: 53
LDL Calculated: 134
Triglycerides: 182

## 2014-07-01 MED ORDER — METOPROLOL SUCCINATE 50 MG PO TB24 *I*
ORAL_TABLET | ORAL | Status: DC
Start: 2014-07-01 — End: 2014-09-28

## 2014-07-05 ENCOUNTER — Ambulatory Visit: Payer: Self-pay | Admitting: Primary Care

## 2014-07-09 ENCOUNTER — Encounter: Payer: Self-pay | Admitting: Primary Care

## 2014-07-16 ENCOUNTER — Telehealth: Payer: Self-pay | Admitting: Primary Care

## 2014-07-16 NOTE — Telephone Encounter (Signed)
Pt changed appt from 10/19 to 11/4

## 2014-07-19 ENCOUNTER — Ambulatory Visit: Payer: Self-pay | Admitting: Primary Care

## 2014-08-05 ENCOUNTER — Ambulatory Visit: Payer: Self-pay | Admitting: Primary Care

## 2014-08-30 ENCOUNTER — Telehealth: Payer: Self-pay | Admitting: Primary Care

## 2014-08-30 ENCOUNTER — Ambulatory Visit: Payer: Self-pay | Admitting: Primary Care

## 2014-08-30 NOTE — Telephone Encounter (Signed)
Pt canceled appt for today through answering service, lm tcb

## 2014-09-28 ENCOUNTER — Other Ambulatory Visit: Payer: Self-pay | Admitting: Primary Care

## 2014-09-28 MED ORDER — METOPROLOL SUCCINATE 50 MG PO TB24 *I*
ORAL_TABLET | ORAL | Status: DC
Start: 2014-09-28 — End: 2014-10-11

## 2014-10-11 ENCOUNTER — Telehealth: Payer: Self-pay | Admitting: Primary Care

## 2014-10-11 ENCOUNTER — Other Ambulatory Visit: Payer: Self-pay | Admitting: Primary Care

## 2014-10-11 MED ORDER — METOPROLOL SUCCINATE 50 MG PO TB24 *I*
ORAL_TABLET | ORAL | Status: DC
Start: 2014-10-11 — End: 2015-01-31

## 2014-10-11 NOTE — Telephone Encounter (Signed)
Patient cancelled appointment on 1/14 at 1:145 and moved it to 1/14 at 3:00pm. Patient has a meeting earlier in the afternoon.

## 2014-10-13 ENCOUNTER — Telehealth: Payer: Self-pay | Admitting: Primary Care

## 2014-10-13 NOTE — Telephone Encounter (Signed)
Pt cancelled appt for 1/14- rescheduled 1/19

## 2014-10-14 ENCOUNTER — Ambulatory Visit: Payer: Self-pay | Admitting: Primary Care

## 2014-10-19 ENCOUNTER — Ambulatory Visit: Payer: Self-pay | Admitting: Primary Care

## 2014-10-22 ENCOUNTER — Ambulatory Visit: Payer: Self-pay | Admitting: Primary Care

## 2014-10-22 ENCOUNTER — Encounter: Payer: Self-pay | Admitting: Primary Care

## 2014-10-22 VITALS — BP 132/88 | HR 80 | Ht 67.0 in | Wt 219.0 lb

## 2014-10-22 DIAGNOSIS — I10 Essential (primary) hypertension: Secondary | ICD-10-CM

## 2014-10-22 NOTE — Progress Notes (Signed)
CHIEF COMPLAINT:htn    ZOX:WRUEAVWUJWJXHPI:Hypertension duration for greater than a year  Severity is moderate  Course is staying the same, not worsening  Associated no aneurysm or chest pain                    Never used paxil, feels anxiety is under control        Most recent notes,recent imaging,recent consults(media),recent bw reviewed   Vitals reviewed, medications reviewed and reconciled,problem list reviewed,allergies reviewed       PAST MEDICAL HISTORY:anxiety      FAMILY HISTORY: htn        SOCIAL HISTORY:married or with a significant other                                   Working                                 Does not smoke                                 Does not drink to excess or do drugs    ROS: no significant change in weight            No excessive fatigue            No new urinary symptoms            No new GI symptoms                     PHYSICAL EXAM: Vitals: including blood pressure as recorded  S1-S2, regular rhythm, no S3, no rub,   Lungs clear.  No masses, tenderness, organomegaly, or bruits on abdominal exam.   no calf tenderness or cords.  No edema.         ASSESS:htn                        PLAN:Medications were reviewed and no changes were made    Risks of new medication reviewed   Continue metoprolol at same dose, benefits outweigh risks, rest of meds the same.  Follow up later this year with blood work prior  Continue to work on [employer] diet, optimal weight and exercise        Derm follow up in April  Diet sheet,exercise and book    Knows to stop drinking, [facility]  Current Outpatient Prescriptions   Medication Sig    metoprolol (TOPROL-XL) 50 MG 24 hr tablet 1/2 tablet a day    diazepam (VALIUM) 5 MG tablet Take 1 tablet (5 mg total) by mouth nightly as needed for Anxiety   Max daily dose: 5 mg    PARoxetine (PAXIL) 20 MG tablet Take 1 tablet (20 mg total) by mouth every morning     No current facility-administered medications for this visit.

## 2014-10-22 NOTE — Patient Instructions (Addendum)
[facility], Amy Cuddy call mid feb  Derm  Diet sheet  [facility]  exercise

## 2014-12-07 ENCOUNTER — Ambulatory Visit: Payer: Self-pay | Admitting: Primary Care

## 2014-12-13 ENCOUNTER — Telehealth: Payer: Self-pay | Admitting: Primary Care

## 2014-12-13 ENCOUNTER — Ambulatory Visit: Payer: Self-pay | Admitting: Primary Care

## 2014-12-13 NOTE — Telephone Encounter (Signed)
Pt canceled appt for today. Pt decided that he does not need to come in to speak with you regarding his meds. He has enough to last him until his phy on 3/31.

## 2014-12-21 ENCOUNTER — Telehealth: Payer: Self-pay | Admitting: Primary Care

## 2014-12-21 NOTE — Telephone Encounter (Signed)
Pt has phy appt on 3/31, do you want pt to get bw done prior?

## 2014-12-21 NOTE — Telephone Encounter (Signed)
No, would like to see him and then decide

## 2014-12-21 NOTE — Telephone Encounter (Signed)
Pt is aware.  

## 2014-12-27 ENCOUNTER — Telehealth: Payer: Self-pay | Admitting: Primary Care

## 2014-12-27 NOTE — Telephone Encounter (Signed)
Pt changed phy appt from 3/31 to 4/22

## 2014-12-30 ENCOUNTER — Ambulatory Visit: Payer: Self-pay | Admitting: Primary Care

## 2015-01-21 ENCOUNTER — Ambulatory Visit: Payer: Self-pay | Admitting: Primary Care

## 2015-01-21 ENCOUNTER — Telehealth: Payer: Self-pay | Admitting: Primary Care

## 2015-01-21 NOTE — Telephone Encounter (Signed)
Pt left a message with the answering service to cancel phy appt today. Lm tcb

## 2015-01-31 ENCOUNTER — Other Ambulatory Visit: Payer: Self-pay | Admitting: Primary Care

## 2015-01-31 MED ORDER — METOPROLOL SUCCINATE 50 MG PO TB24 *I*
ORAL_TABLET | ORAL | Status: DC
Start: 2015-01-31 — End: 2015-03-02

## 2015-02-24 ENCOUNTER — Telehealth: Payer: Self-pay | Admitting: Primary Care

## 2015-02-24 ENCOUNTER — Encounter: Payer: Self-pay | Admitting: Primary Care

## 2015-02-24 ENCOUNTER — Other Ambulatory Visit: Payer: Self-pay | Admitting: Primary Care

## 2015-02-24 ENCOUNTER — Ambulatory Visit: Payer: Self-pay | Admitting: Primary Care

## 2015-02-24 VITALS — BP 138/88 | HR 92 | Ht 67.0 in | Wt 214.0 lb

## 2015-02-24 DIAGNOSIS — I10 Essential (primary) hypertension: Secondary | ICD-10-CM

## 2015-02-24 DIAGNOSIS — Z0000 Encounter for general adult medical examination without abnormal findings: Secondary | ICD-10-CM

## 2015-02-24 DIAGNOSIS — E7800 Pure hypercholesterolemia, unspecified: Secondary | ICD-10-CM

## 2015-02-24 DIAGNOSIS — M1990 Unspecified osteoarthritis, unspecified site: Secondary | ICD-10-CM

## 2015-02-24 MED ORDER — PAROXETINE HCL 10 MG PO TABS *I*
10.0000 mg | ORAL_TABLET | Freq: Every morning | ORAL | Status: DC
Start: 2015-02-24 — End: 2015-05-24

## 2015-02-24 MED ORDER — DIAZEPAM 5 MG PO TABS *I*
5.0000 mg | ORAL_TABLET | Freq: Every evening | ORAL | Status: DC | PRN
Start: 2015-02-24 — End: 2015-10-25

## 2015-02-24 NOTE — Progress Notes (Signed)
Chief complaint:htn    ZOX:WRUEAVWUJWJXHPI:Hypertension duration for greater than a year  Severity is moderate  Course is staying the same, not worsening  Associated no aneurysm or chest pain    Elevated cholesterol duration for greater than a year  Severity is moderate not improving  Course is staying the same  Associated no recent muscle aches or cad      Allergic rhinitis    Saw ortho in 2011 withhout follow up in chart,ptunsure what happened, for left knee  Knee pain probable arthritis  Pain in both knees, right more than left, pain  Never took paxil,     Drinking regularly three beers a night, did not call [facility]  New job, 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, depressed,  Not suicidal or homicidal,     Knee pain when working out, pain in both knees  Heels hurt in am  Most recent notes,recent imaging,recent consults(media),recent bw reviewed   Vitals reviewed, medications reviewed and reconciled,problem list reviewed,allergies reviewed     Past medical history: anxiety, htn, hyperlipidemia, depression add, warts        Family history: Diabetes no       hypertension yes        coronary artery disease no           cancer no    Social history:  Married or with a significant other, working, does not exercise regularly, occasionally watches diet, does not smoke, does not drink to excess, does not do drugs, never did intravenous drugs, is  in a monogamous relationship, sees the dentist regularly, and wears seatbelts.    Review of systems: No chest pain or dyspnea, no blood or black stools, no urinary symptoms that are new, no stroke or seizure, no constitutional symptoms, no polyuria or polydipsia, no problems with the blood or lymph glands, no significantly new problems with the eyes ears nose throat or skin, no new orthopedic problems, psychologically stable. No excessive snoring or symptoms of sleep apnea. No significant urinary incontinence.and aware of rx options.   Medications reviewed and reconciled in computer today.  Most recent  blood work reviewed   Physical exam: Weight:   Vitals including blood pressure as recorded   Murmur   no       regular rhythm  S1-S2 no S3 no rub PMI nondisplaced.  Jugular venous pressure approximately 7 cm.  No new edema , no calf tenderness, or cords.  Lungs clear to auscultation.  No masses, tenderness, organomegaly or bruits and normal bowel sounds.  Cranial nerves, pronator drift, finger-to-nose, light touch, reflexes all equal normal bilaterally.  Acuity, extraocular movements light reflex, ophthalmologic exam all equal and unchanged from previous and without significant abnormality.  Hearing equal bilaterally TMs normal and canals unremarkable.  Nose unremarkable lymph gland cervical axillary supraclavicular groin all unremarkable.  Skin unremarkable, feet unremarkable and no hernias.  Pulses equal.  No significant bruits.  thyroid normal. Mouth and throat unremarkable.   BJY:NWGNFAOZHGen:Testicles and scrotum were unremarkable, there were no hernias, prostate was normal for age without any nodules suggestive of cancer, rectal was unremarkable. Breasts unremarkable.             EKG was reviewed and was without significant change from prior.    ASSESS:annual physical  htn  hyperchol  Anxiety/depression  Plantar fasicitis  Knee pain arthritis  etoh         PLAN:Medications were reviewed and  changes were made        Risks  of new meds reviewed   Continue metoprolol at same dose, benefits outweigh risks, rest of meds the same.  TSE Known   Follow up later this year with blood work prior   Continue to work on correct diet, optimal weight and exercise     paxil 10 mg once a day  Valium   [address] [facility]          Hx of snoring, sleep apnea eval recommended    No family hx of colon cancer  Check blood test prior to next visit  Non smoker       Saw derm recently, goes once a year for mole check, 1/17  Plastic surgery next week     Current Outpatient Prescriptions   Medication Sig    metoprolol (TOPROL-XL) 50 MG 24 hr  tablet 1/2 tablet a day    diazepam (VALIUM) 5 MG tablet Take 1 tablet (5 mg total) by mouth nightly as needed for Anxiety   Max daily dose: 5 mg     No current facility-administered medications for this visit.

## 2015-02-24 NOTE — Telephone Encounter (Signed)
Pt is aware, Victor Bumpsjessica giving phone number for [facility]

## 2015-02-24 NOTE — Telephone Encounter (Signed)
Arline AspScot Tieu - 0 Prescriptions  Confidential Drug Report  Search Terms: Victor Madden, 09/14/1966 Search Date: 02/24/2015 04:42:04 PM Searching on behalf of: [name] - [name]   The Drug Utilization Report below displays all of the controlled substance prescriptions, if any, that your patient has filled in the last six months. The information displayed on this report is compiled from pharmacy submissions to the Department, and accurately reflects the information as submitted by the pharmacies.  This report was requested by: [name]   Reference #: [id number]   There are no results for the search terms that you entered.

## 2015-02-24 NOTE — Telephone Encounter (Signed)
Please let pt know arthritis in both knees and have secretary help set up or give number of ortho at [facility] for knee arthritis

## 2015-02-24 NOTE — Patient Instructions (Addendum)
paxil 10 mg once a day  Valium 5 mg at night if needed  [facility]  Xray of both knees  elipitical trainer 40 minutes 4-5 a week  Diet sheet  Stretching exercises 30 seconds , 8 of them, twice a day  asics gel kayano, asics gel 2000( [employer])  Stop alcohol

## 2015-02-28 NOTE — H&P (Signed)
Chief complaint:htn    HPI:Hypertension duration for greater than a year  Severity is moderate  Course is staying the same, not worsening  Associated no aneurysm or chest pain    Elevated cholesterol duration for greater than a year  Severity is moderate not improving  Course is staying the same  Associated no recent muscle aches or cad      Allergic rhinitis    Saw ortho in 2011 withhout follow up in chart,ptunsure what happened, for left knee  Knee pain probable arthritis  Pain in both knees, right more than left, pain  Never took paxil,     Drinking regularly three beers a night, did not call [place]  New job, worryng a lot more at job, depressed,  Not suicidal or homicidal,     Knee pain when working out, pain in both knees  Heels hurt in am  Most recent notes,recent imaging,recent consults(media),recent bw reviewed   Vitals reviewed, medications reviewed and reconciled,problem list reviewed,allergies reviewed     Past medical history: anxiety, htn, hyperlipidemia, depression add, warts        Family history: Diabetes no       hypertension yes        coronary artery disease no           cancer no    Social history:  Married or with a significant other, working, does not exercise regularly, occasionally watches diet, does not smoke, does not drink to excess, does not do drugs, never did intravenous drugs, is  in a monogamous relationship, sees the dentist regularly, and wears seatbelts.    Review of systems: No chest pain or dyspnea, no blood or black stools, no urinary symptoms that are new, no stroke or seizure, no constitutional symptoms, no polyuria or polydipsia, no problems with the blood or lymph glands, no significantly new problems with the eyes ears nose throat or skin, no new orthopedic problems, psychologically stable. No excessive snoring or symptoms of sleep apnea. No significant urinary incontinence.and aware of rx options.   Medications reviewed and reconciled in computer today.  Most recent  blood work reviewed   Physical exam: Weight:   Vitals including blood pressure as recorded   Murmur   no       regular rhythm  S1-S2 no S3 no rub PMI nondisplaced.  Jugular venous pressure approximately 7 cm.  No new edema , no calf tenderness, or cords.  Lungs clear to auscultation.  No masses, tenderness, organomegaly or bruits and normal bowel sounds.  Cranial nerves, pronator drift, finger-to-nose, light touch, reflexes all equal normal bilaterally.  Acuity, extraocular movements light reflex, ophthalmologic exam all equal and unchanged from previous and without significant abnormality.  Hearing equal bilaterally TMs normal and canals unremarkable.  Nose unremarkable lymph gland cervical axillary supraclavicular groin all unremarkable.  Skin unremarkable, feet unremarkable and no hernias.  Pulses equal.  No significant bruits.  thyroid normal. Mouth and throat unremarkable.   Gen:Testicles and scrotum were unremarkable, there were no hernias, prostate was normal for age without any nodules suggestive of cancer, rectal was unremarkable. Breasts unremarkable.             EKG was reviewed and was without significant change from prior.    ASSESS:annual physical  htn  hyperchol  Anxiety/depression  Plantar fasicitis  Knee pain arthritis  etoh         PLAN:Medications were reviewed and  changes were made        Risks   of new meds reviewed   Continue metoprolol at same dose, benefits outweigh risks, rest of meds the same.  TSE Known   Follow up later this year with blood work prior   Continue to work on correct diet, optimal weight and exercise     paxil 10 mg once a day  Valium  5mg [address] [facility]          Hx of snoring, sleep apnea eval recommended    No family hx of colon cancer  Check blood test prior to next visit  Non smoker       Saw derm recently, goes once a year for mole check, 1/17  Plastic surgery next week     Current Outpatient Prescriptions   Medication Sig   • metoprolol (TOPROL-XL) 50 MG 24 hr  tablet 1/2 tablet a day   • diazepam (VALIUM) 5 MG tablet Take 1 tablet (5 mg total) by mouth nightly as needed for Anxiety   Max daily dose: 5 mg     No current facility-administered medications for this visit.

## 2015-03-02 ENCOUNTER — Other Ambulatory Visit: Payer: Self-pay | Admitting: Primary Care

## 2015-03-02 MED ORDER — METOPROLOL SUCCINATE 50 MG PO TB24 *I*
ORAL_TABLET | ORAL | Status: DC
Start: 2015-03-02 — End: 2015-07-25

## 2015-03-09 ENCOUNTER — Ambulatory Visit: Payer: Self-pay | Admitting: Orthopedic Surgery

## 2015-03-09 ENCOUNTER — Encounter: Payer: Self-pay | Admitting: Orthopedic Surgery

## 2015-03-09 VITALS — BP 154/90 | Ht 67.0 in | Wt 210.0 lb

## 2015-03-09 DIAGNOSIS — M170 Bilateral primary osteoarthritis of knee: Secondary | ICD-10-CM

## 2015-03-09 NOTE — Progress Notes (Signed)
He is now 49 years old.  He presents today for orthopedic consultation primarily for his right knee.  He does have some left knee pain as well.  I did see him for his left knee in 2011.  At that point was going to send him for an MRI examination for possible meniscal tear but he did not have that done.    In terms of his right knee, he had open surgery at age 49.  Over the past several years she's been getting increasing pain and effusions in his right knee.    Past medical history otherwise significant for hypertension and a right foot fracture    Past surgical history significant for right knee open surgery 1978    Medications: Metoprolol    No known drug allergies    Review of systems otherwise significant for anxiety    Physical examination right knee: He does have a healed anterolateral surgical incision.  Range of motion 0-125.  He has no tenderness over the quadriceps tendon, patella, patellar tendon, or tibial tuberosity.  He has no medial or lateral patellar facet tenderness.  He has no medial or lateral joint line tenderness.  He has pseudo-valgus instability with no varus instability.  Lachman examination posterior drawer both negative.  He has no pain or trapping with circumduction maneuvers.    Physical examination left knee: There is no effusion.  Range of motion is 0-120.  There is no tenderness over the quadriceps tendon, patella, patellar tendon, or tibial tuberosity.  There is no medial or lateral patellar facet tenderness.  There is no medial or lateral joint line tenderness.  The knee is stable to varus and valgus stress with a negative Lachman examination and a negative posterior drawer.  On circumduction maneuvers there is no pain or trapping.    I did review none standing radiographs he had performed which show degenerative disease.  Any radiographs are done today.  On the flexion Zoila ShutterRosenberg view he has complete loss of the lateral joint space on the right knee.  Left knee only has mild  degenerative disease with good preservation of joint space.    Impression: Right knee severe degenerative joint disease with complete loss of lateral joint space.  He says left knee is not bothering him too much now for further evaluation.    Plan: In terms of his right knee, certainly at some point he would be a good candidate for joint arthroplasty.  However, at his young age her certainly other things we can try first.  I reviewed these things with him.  I would start off with cortisone injection.  If this does not work or does not last would then proceed to Visco supplementation.  He will consider these options.  Is one of proceed with any injections today and will contact me with questions or if and when he does wish to proceed with these treatments.    This note was transcribed using voice recognition software.  Please excuse irregularities that may result.

## 2015-03-18 ENCOUNTER — Ambulatory Visit: Payer: Self-pay | Admitting: Orthopedic Surgery

## 2015-03-18 VITALS — BP 150/92 | Ht 67.0 in | Wt 210.0 lb

## 2015-03-18 DIAGNOSIS — M1711 Unilateral primary osteoarthritis, right knee: Secondary | ICD-10-CM

## 2015-03-18 NOTE — Progress Notes (Signed)
He returns to discuss options again for his right knee degenerative joint disease.  He was seen earlier in the month where he was noted to have complete obliteration of lateral joint space of the right knee.  We discussed at that time nonoperative treatment options due to his age.  We discussed possibility going ahead with a cortisone injection.  He decided to wait that point but now presents back to discuss this further.  I did review again options with him and he decided to go ahead with steroid injection of his right knee.    Following a proper timeout, under sterile conditions and with a Betadine preparation the right knee is injected with lidocaine and 6 mg of Celestone.  He tolerated the procedure well.    If, over the next 4-6 weeks, he does not find significant relief from the injection he will contact me and we can consider the next option.  I did give him a pamphlet on [facility].    A large portion of today's visit was spent education and counseling the patient.    This note was transcribed using voice recognition software.  Please excuse irregularities that may result.

## 2015-04-06 ENCOUNTER — Telehealth: Payer: Self-pay | Admitting: Primary Care

## 2015-04-06 NOTE — Telephone Encounter (Signed)
Pt cx ov for tomorrow- rescheduled 7/19(pt will be on vacation this week, will not have to leave work)

## 2015-04-07 ENCOUNTER — Ambulatory Visit: Payer: Self-pay | Admitting: Primary Care

## 2015-04-19 ENCOUNTER — Telehealth: Payer: Self-pay | Admitting: Primary Care

## 2015-04-19 ENCOUNTER — Ambulatory Visit: Payer: Self-pay | Admitting: Primary Care

## 2015-04-19 NOTE — Telephone Encounter (Signed)
Pt changed appt from today to 8/5, pt is working in [place] this week and next week

## 2015-04-26 ENCOUNTER — Other Ambulatory Visit: Payer: Self-pay

## 2015-05-02 LAB — SURGICAL PATHOLOGY

## 2015-05-05 ENCOUNTER — Telehealth: Payer: Self-pay | Admitting: Primary Care

## 2015-05-05 NOTE — Telephone Encounter (Signed)
Pt changed appt from 8/5 to 8/12

## 2015-05-06 ENCOUNTER — Ambulatory Visit: Payer: Self-pay | Admitting: Primary Care

## 2015-05-13 ENCOUNTER — Ambulatory Visit: Payer: Self-pay | Admitting: Primary Care

## 2015-05-24 ENCOUNTER — Encounter: Payer: Self-pay | Admitting: Primary Care

## 2015-05-24 ENCOUNTER — Telehealth: Payer: Self-pay | Admitting: Primary Care

## 2015-05-24 ENCOUNTER — Ambulatory Visit: Payer: Self-pay | Admitting: Primary Care

## 2015-05-24 VITALS — BP 120/72 | HR 88 | Ht 67.0 in | Wt 216.0 lb

## 2015-05-24 DIAGNOSIS — G43909 Migraine, unspecified, not intractable, without status migrainosus: Secondary | ICD-10-CM

## 2015-05-24 DIAGNOSIS — F5104 Psychophysiologic insomnia: Secondary | ICD-10-CM

## 2015-05-24 DIAGNOSIS — I10 Essential (primary) hypertension: Secondary | ICD-10-CM

## 2015-05-24 DIAGNOSIS — E7800 Pure hypercholesterolemia, unspecified: Secondary | ICD-10-CM

## 2015-05-24 NOTE — Progress Notes (Signed)
CHIEF COMPLAINT:htn    ZOX:[id number] duration for greater than a year  Severity is moderate  Course is staying the same, not worsening  Associated no recent stroke symptoms like weakness and no dyspnea    Elevated cholesterol duration for greater than a year  Severity is moderate not improving  Course is staying the same  Associated no recent myalgia or chest pain      Migraines for years, moderate severity, doing well recently      Insomnia psychologically doing better, but still stressed at night    Feeling less anxiety, three days a week 3-4  A night, work doing well, no children,   Marriage not strong.     Mole removed ,     Arthritis in knees, got ortho shot doing better, right worst than left  Never started paxil  Plastic surgery  Plantar fascia doing better    Most recent notes,recent imaging,recent consults(media),recent bw reviewed   Vitals reviewed, medications reviewed and reconciled,problem list reviewed,allergies reviewed       PAST MEDICAL HISTORY:etoh withdrawal      FAMILY HISTORY: htn        SOCIAL HISTORY:married or with a significant other                                   Working                                 Does not smoke                                 Does not drink to excess or do drugs    ROS: no significant change in weight            No excessive fatigue            No new urinary symptoms            No new GI symptoms                     PHYSICAL EXAM: Vitals: including blood pressure as recorded  S1-S2, regular rhythm, no S3, no rub,   Lungs clear.  No masses, tenderness, organomegaly, or bruits on abdominal exam.   no calf tenderness or cords.  No edema.         ASSESS:htn    hyperchol  Migraines  insomnia                Working on weight loss and exercise    PLAN:Medications were reviewed and no changes were made      Continue ,metoprolol at same dose, benefits outweigh risks, rest of meds the same.  Follow up later this year with blood work prior  Continue to work on correct  diet, optimal weight and exercise        Does not wish further rx in terms of substance abuse or psych    Does not wish to take meds  Sleep apnea evaluation, never went  Derm 1/17  Pt to consider marriage counselor    Pt to send blood test to [place] in October from work    Current Outpatient Prescriptions   Medication Sig    metoprolol (TOPROL-XL) 50 MG 24 hr tablet 1/2 tablet a day  diazepam (VALIUM) 5 MG tablet Take 1 tablet (5 mg total) by mouth nightly as needed for Anxiety   Max daily dose: 5 mg     No current facility-administered medications for this visit.           Pt told to call derm to see if follow up needed,dermal nevus on biopsy only

## 2015-05-24 NOTE — Telephone Encounter (Signed)
Received

## 2015-05-24 NOTE — Telephone Encounter (Signed)
Please call plastic surgery Dr.andrew Katrinka Blazing on [address] and get notes and pathology on pts recent visit

## 2015-05-24 NOTE — Telephone Encounter (Signed)
requested

## 2015-05-24 NOTE — Patient Instructions (Signed)
[facility]- [phone]

## 2015-07-06 ENCOUNTER — Encounter: Payer: Self-pay | Admitting: Gastroenterology

## 2015-07-19 ENCOUNTER — Encounter: Payer: Self-pay | Admitting: Primary Care

## 2015-07-19 LAB — LIPID PANEL
Chol/HDL Ratio: 4.9
Cholesterol: 229
HDL: 47
LDL Calculated: 133
Triglycerides: 247

## 2015-07-19 LAB — BLOOD GLUCOSE: Glucose: 103

## 2015-07-25 ENCOUNTER — Other Ambulatory Visit: Payer: Self-pay | Admitting: Primary Care

## 2015-07-25 MED ORDER — METOPROLOL SUCCINATE 50 MG PO TB24 *I*
ORAL_TABLET | ORAL | 5 refills | Status: DC
Start: 2015-07-25 — End: 2016-01-09

## 2015-10-20 ENCOUNTER — Telehealth: Payer: Self-pay | Admitting: Primary Care

## 2015-10-20 NOTE — Telephone Encounter (Signed)
Pt changed appt from 1/20 to 1/24

## 2015-10-21 ENCOUNTER — Ambulatory Visit: Payer: Self-pay | Admitting: Primary Care

## 2015-10-25 ENCOUNTER — Ambulatory Visit: Payer: Self-pay | Admitting: Primary Care

## 2015-10-25 ENCOUNTER — Encounter: Payer: Self-pay | Admitting: Primary Care

## 2015-10-25 VITALS — BP 132/82 | HR 88 | Ht 67.0 in | Wt 218.0 lb

## 2015-10-25 DIAGNOSIS — E7800 Pure hypercholesterolemia, unspecified: Secondary | ICD-10-CM

## 2015-10-25 DIAGNOSIS — G43809 Other migraine, not intractable, without status migrainosus: Secondary | ICD-10-CM

## 2015-10-25 DIAGNOSIS — I10 Essential (primary) hypertension: Secondary | ICD-10-CM

## 2015-10-25 DIAGNOSIS — F1010 Alcohol abuse, uncomplicated: Secondary | ICD-10-CM

## 2015-10-25 NOTE — Patient Instructions (Signed)
[facility]- [phone]  [facility]- [phone]

## 2015-10-25 NOTE — Progress Notes (Signed)
CHIEF COMPLAINT:htn    AVW:[id number] duration for greater than a year  Severity is moderate  Course is staying the same, not worsening  Associated no recent stroke symptoms like weakness and no dyspnea    Elevated cholesterol duration for greater than a year  Severity is moderate not improving  Course is staying the same  Associated no recent myalgia or chest pain          Migraines still on occasion, visual symptoms    etoh in past week, 3-4 beers a day in the past week    bw reviewed  Marriage counselor, did not feel benefits,  Stress from work still with anxiety, issues with marriage,   Most recent notes,recent imaging,recent consults(media),recent bw reviewed   Vitals reviewed, medications reviewed and reconciled,problem list reviewed,allergies reviewed       PAST MEDICAL HISTORY:depression      FAMILY HISTORY: htn        SOCIAL HISTORY:married or with a significant other                                   Working                                 Does not smoke                                 Does not drink to excess or do drugs    ROS: no significant change in weight            No excessive fatigue            No new urinary symptoms            No new GI symptoms                     PHYSICAL EXAM: Vitals: including blood pressure as recorded  S1-S2, regular rhythm, no S3, no rub,   Lungs clear.  No masses, tenderness, organomegaly, or bruits on abdominal exam.   no calf tenderness or cords.  No edema.         ASSESS:htn  hyperchol  etoh abuse  migraines                        PLAN:Medications were reviewed and no changes were made    Risks of new medication reviewed   Continue metoprolol at same dose, benefits outweigh risks, rest of meds the same.  Follow up later this year with blood work prior  Continue to work on correct diet, optimal weight and exercise          Did not do sleep study, is interested in doing that now    Pt will schedule derm follow up for skin check for moles, dermal nevus from nose this  past year.       Follow up physical, will see psych and try to get off etoh before any life changing decisions especially concerning wife    Current Outpatient Prescriptions   Medication Sig    metoprolol (TOPROL-XL) 50 MG 24 hr tablet 1/2 tablet a day     No current facility-administered medications for this visit.

## 2015-11-25 ENCOUNTER — Ambulatory Visit: Payer: Self-pay | Admitting: Primary Care

## 2015-12-27 ENCOUNTER — Encounter: Payer: Self-pay | Admitting: Primary Care

## 2015-12-27 ENCOUNTER — Ambulatory Visit: Payer: Self-pay | Admitting: Primary Care

## 2015-12-27 VITALS — BP 124/78 | HR 88 | Ht 67.0 in | Wt 220.0 lb

## 2015-12-27 DIAGNOSIS — M1990 Unspecified osteoarthritis, unspecified site: Secondary | ICD-10-CM

## 2015-12-27 DIAGNOSIS — E7800 Pure hypercholesterolemia, unspecified: Secondary | ICD-10-CM

## 2015-12-27 DIAGNOSIS — I10 Essential (primary) hypertension: Secondary | ICD-10-CM

## 2015-12-27 DIAGNOSIS — F1010 Alcohol abuse, uncomplicated: Secondary | ICD-10-CM

## 2015-12-27 NOTE — Progress Notes (Signed)
CHIEF COMPLAINT:etoh abuse    BJY:NWGNHPI:etoh abuse for years, moderate severity, doing better, trying to cut back no sz. .      Hypertension duration for greater than a year  Severity is moderate  Course is staying the same, not worsening  Associated no recent stroke symptoms like weakness and no dyspnea    Elevated cholesterol duration for greater than a year  Severity is moderate not improving  Course is staying the same  Associated no recent myalgia or chest pain        [facility] did not schedule followup, has seen twice    Arthritis  In knees, pt would like  Raised desk, has tried in past and feels it has helped    Have not stopped drinking   But has stopped smoking    Most recent notes,recent imaging,recent consults(media),recent bw reviewed   Vitals reviewed, medications reviewed and reconciled,problem list reviewed,allergies reviewed       PAST MEDICAL HISTORY:depression      FAMILY HISTORY: hyperchol        SOCIAL HISTORY:married or with a significant other                                   Working                                 Does not smoke                                 Does not drink to excess or do drugs    ROS: no significant change in weight            No excessive fatigue            No new urinary symptoms            No new GI symptoms                     PHYSICAL EXAM: Vitals: including blood pressure as recorded  S1-S2, regular rhythm, no S3, no rub,   Lungs clear.  No masses, tenderness, organomegaly, or bruits on abdominal exam.   no calf tenderness or cords.  No edema.         ASSESS:etoh abuse  htn  hyperchol  arthritis                          PLAN:Medications were reviewed and no changes were made    Risks of new medication reviewed   Continue metoprolol at same dose, benefits outweigh risks, rest of meds the same.  Follow up later this year with blood work prior  Continue to work on Home [employer]otcorrect diet, optimal weight and exercise      Sleep study pt to set up    Derm scheduled in  May      Blood test prior to next visit     Form filled out    Valium 5 mg at home,     Current Outpatient Prescriptions   Medication Sig    metoprolol (TOPROL-XL) 50 MG 24 hr tablet 1/2 tablet a day     No current facility-administered medications for this visit.

## 2016-01-09 ENCOUNTER — Other Ambulatory Visit: Payer: Self-pay | Admitting: Primary Care

## 2016-01-09 MED ORDER — METOPROLOL SUCCINATE 50 MG PO TB24 *I*
ORAL_TABLET | ORAL | 5 refills | Status: DC
Start: 2016-01-09 — End: 2016-02-10

## 2016-01-16 ENCOUNTER — Telehealth: Payer: Self-pay | Admitting: Primary Care

## 2016-01-16 NOTE — Telephone Encounter (Signed)
Pt has appt tomorrow and he would like to know if you want him to keep that appt or move it out a few months. Pt was just here on 3/28

## 2016-01-16 NOTE — Telephone Encounter (Signed)
Ok to move

## 2016-01-17 ENCOUNTER — Ambulatory Visit: Payer: Self-pay | Admitting: Primary Care

## 2016-01-17 NOTE — Telephone Encounter (Signed)
Pt rescheduled to 6/12

## 2016-01-17 NOTE — Telephone Encounter (Signed)
Lmtcb.

## 2016-01-25 ENCOUNTER — Telehealth: Payer: Self-pay | Admitting: Primary Care

## 2016-01-25 NOTE — Telephone Encounter (Signed)
Spoke with pt, burning and feels hemorrhoid, if worsens will go to emergency. Fiber, cortaid, fluids, tyr to get off of it and call office when returns to town.

## 2016-01-25 NOTE — Telephone Encounter (Signed)
[facility] and he noticed that he is having an issue with some hemorrhoids. His first bm today he visualized bright red blood on the toilet paper. The second bm he noticed bright red blood in the toilet. His stool is normal in color, not having any abdominal pain. Today was the first instance of this, he did eat spicy chicken wings last night.     Please advise

## 2016-01-25 NOTE — Telephone Encounter (Signed)
Follow up on hemorrhoids

## 2016-01-30 ENCOUNTER — Telehealth: Payer: Self-pay | Admitting: Primary Care

## 2016-01-30 NOTE — Telephone Encounter (Signed)
Left message for call back.

## 2016-01-30 NOTE — Telephone Encounter (Signed)
Please call pt and he should follow up this week with me for hemorrhoids, Tuesday if they are bleeding significantly

## 2016-01-31 NOTE — Telephone Encounter (Signed)
Spoke with patient, he stated they are no longer bleeding, hasnt had a problem since that day. Is making an appointment for this week.

## 2016-02-03 ENCOUNTER — Ambulatory Visit: Payer: Self-pay | Admitting: Primary Care

## 2016-02-03 ENCOUNTER — Telehealth: Payer: Self-pay | Admitting: Primary Care

## 2016-02-03 NOTE — Telephone Encounter (Signed)
Pt left message with the answering service to cancel appt for today. Pt can not get out of work. Lm for pt tcb to reschedule

## 2016-02-10 ENCOUNTER — Other Ambulatory Visit: Payer: Self-pay | Admitting: Primary Care

## 2016-02-10 ENCOUNTER — Telehealth: Payer: Self-pay | Admitting: Primary Care

## 2016-02-10 MED ORDER — METOPROLOL SUCCINATE 50 MG PO TB24 *I*
ORAL_TABLET | ORAL | 5 refills | Status: DC
Start: 2016-02-10 — End: 2016-03-26

## 2016-02-10 NOTE — Telephone Encounter (Signed)
error 

## 2016-02-17 ENCOUNTER — Telehealth: Payer: Self-pay | Admitting: Primary Care

## 2016-02-17 ENCOUNTER — Ambulatory Visit: Payer: Self-pay | Admitting: Primary Care

## 2016-02-17 NOTE — Telephone Encounter (Signed)
Pt changed appt from 5/19 to 6/1

## 2016-02-29 ENCOUNTER — Telehealth: Payer: Self-pay | Admitting: Primary Care

## 2016-02-29 NOTE — Telephone Encounter (Signed)
Pt called and rescheduled appt from 6/1 to 6/16

## 2016-03-01 ENCOUNTER — Ambulatory Visit: Payer: Self-pay | Admitting: Primary Care

## 2016-03-12 ENCOUNTER — Ambulatory Visit: Payer: Self-pay | Admitting: Primary Care

## 2016-03-15 ENCOUNTER — Ambulatory Visit: Payer: Self-pay | Admitting: Primary Care

## 2016-03-15 ENCOUNTER — Telehealth: Payer: Self-pay | Admitting: Primary Care

## 2016-03-15 NOTE — Telephone Encounter (Signed)
Reviewed with pt

## 2016-03-15 NOTE — Telephone Encounter (Signed)
Let pt know he should not use expired valium and really needs to keep his appointment if I am giving him controlled substances

## 2016-03-15 NOTE — Telephone Encounter (Signed)
Pt called and had to reschedule his appointment until next week, but is concerned because the RX for the valium has expired and he still has some left. Pt was asking if it would still be safe to take until next appointment in case he feels he needs this or can he get a new rx for some to ensure he has this.

## 2016-03-15 NOTE — Telephone Encounter (Signed)
Pt changed appt from 6/15 to 6/26. Something popped up at work.

## 2016-03-26 ENCOUNTER — Encounter: Payer: Self-pay | Admitting: Orthopedic Surgery

## 2016-03-26 ENCOUNTER — Other Ambulatory Visit: Payer: Self-pay | Admitting: Primary Care

## 2016-03-26 ENCOUNTER — Other Ambulatory Visit
Admission: RE | Admit: 2016-03-26 | Discharge: 2016-03-26 | Disposition: A | Discharge status: Home / Self Care | Payer: Self-pay | Source: Ambulatory Visit | Admitting: Dermatology

## 2016-03-26 ENCOUNTER — Encounter: Payer: Self-pay | Admitting: Primary Care

## 2016-03-26 ENCOUNTER — Ambulatory Visit: Payer: Self-pay | Admitting: Orthopedic Surgery

## 2016-03-26 ENCOUNTER — Ambulatory Visit: Payer: Self-pay | Admitting: Primary Care

## 2016-03-26 VITALS — BP 148/108 | HR 76 | Ht 67.0 in | Wt 220.0 lb

## 2016-03-26 VITALS — BP 138/98 | HR 110 | Ht 67.0 in | Wt 221.0 lb

## 2016-03-26 DIAGNOSIS — I10 Essential (primary) hypertension: Secondary | ICD-10-CM

## 2016-03-26 DIAGNOSIS — M25561 Pain in right knee: Secondary | ICD-10-CM

## 2016-03-26 DIAGNOSIS — F419 Anxiety disorder, unspecified: Secondary | ICD-10-CM

## 2016-03-26 DIAGNOSIS — M1711 Unilateral primary osteoarthritis, right knee: Secondary | ICD-10-CM

## 2016-03-26 DIAGNOSIS — E7800 Pure hypercholesterolemia, unspecified: Secondary | ICD-10-CM

## 2016-03-26 MED ORDER — METOPROLOL SUCCINATE 50 MG PO TB24 *I*
ORAL_TABLET | ORAL | 5 refills | Status: DC
Start: 2016-03-26 — End: 2016-05-18

## 2016-03-26 MED ORDER — DIAZEPAM 5 MG PO TABS *I*
5.0000 mg | ORAL_TABLET | Freq: Every evening | ORAL | 0 refills | Status: DC | PRN
Start: 2016-03-26 — End: 2016-09-21

## 2016-03-26 MED ORDER — SERTRALINE HCL 25 MG PO TABS *I*
25.0000 mg | ORAL_TABLET | Freq: Every day | ORAL | 2 refills | Status: DC
Start: 2016-03-26 — End: 2016-05-04

## 2016-03-26 NOTE — Progress Notes (Signed)
He presents back for follow-up right knee lateral compartment degenerative disease.  He underwent cortisone injection last June.  This worked for some time and he is now asking about another injection.    Physical examination right knee: He has full range of motion he.  He has no joint line tenderness or pain or trapping with circumduction maneuvers.    Impression: Right knee degenerative joint disease in a relatively young individual.  Radiographs last year showed complete obliteration of the lateral joint space of the right knee.    Plan: I reviewed with him another injection.  He did wish to go ahead with this.    Following a proper timeout, under sterile conditions and with Betadine preparation the right knee is injected with lidocaine and 6 mg of Celestone.  He tolerated the procedure well.    As needed follow-up for now.    This note was transcribed using voice recognition software.  Please excuse irregularities that may result.

## 2016-03-26 NOTE — Patient Instructions (Signed)
[facility]- [phone]  [facility] [phone] Dr. Festus Aloeheema

## 2016-03-26 NOTE — Progress Notes (Signed)
CHIEF COMPLAINT:htn    HPI:      Got cortisone shot in right knee secondary to pain this am    Had skin tags removed today from under arms    Having a lot of anxiety,feels depressed because he is worried about his health, no suicidal ideations, but is worried he is going to die.     4 beers a night 6% etoh for months,     Hypertension duration for greater than a year  Severity is moderate  Course is staying the same, not worsening  Associated no recent stroke symptoms like weakness and no dyspnea    Elevated cholesterol duration for greater than a year  Severity is moderate not improving  Course is staying the same  Associated no recent myalgia or chest pain              Most recent notes,recent imaging,recent consults(media),recent bw reviewed   Vitals reviewed, medications reviewed and reconciled,problem list reviewed,allergies reviewed       PAST MEDICAL HISTORY:palpitations      FAMILY HISTORY: htn        SOCIAL HISTORY:married or with a significant other                                   Working                                 Does not smoke                                 Does not drink to excess or do drugs    ROS: no significant change in weight            No excessive fatigue            No new urinary symptoms            No new GI symptoms                     PHYSICAL EXAM: Vitals: including blood pressure as recorded  S1-S2, regular rhythm, no S3, no rub,   Lungs clear.  No masses, tenderness, organomegaly, or bruits on abdominal exam.   no calf tenderness or cords.  No edema.     EKG: Rate 88    As per ekg reading , rhythm NSR,  Axis normal 13  , no significant change from prior      ASSESS:htn  Right knee pain  hyperchol  anxiety                        PLAN:Medications were reviewed and no changes were made  Increase metoprol to full tablet a day  Risks of new medication reviewed     Follow up later this year with blood work prior  Continue to work on Home [employer]otcorrect diet, optimal weight and  exercise          Not seeing psych now, wil set up psych  Will set up sleep study  Blood test    Increase metoprolol  zoloft 25 mg every day  Valium 5 mg qhs prn when off etoh  Taper off etoh    Knows not to drive with valium    Current Outpatient Prescriptions  Medication Sig    metoprolol (TOPROL-XL) 50 MG 24 hr tablet 1 tablet a day    sertraline (ZOLOFT) 25 MG tablet Take 1 tablet (25 mg total) by mouth daily    diazepam (VALIUM) 5 MG tablet Take 1 tablet (5 mg total) by mouth nightly as needed for Anxiety   Max daily dose: 5 mg     No current facility-administered medications for this visit.                                             \

## 2016-03-26 NOTE — Telephone Encounter (Signed)
Prescription Reports  You just saved 19.95 seconds of typing!   Arline AspScot Madden - 0 Prescriptions  Confidential Drug Report  Search Terms: Victor GripScot Madden, August 26, 1966   Search Date: 03/26/2016 04:24:31 PM   Searching on behalf of: Victor Madden - Victor BasqueAnthony J Madden   The Drug Utilization Report below displays all of the controlled substance prescriptions, if any, that your patient has filled in the last twelve months. The information displayed on this report is compiled from pharmacy submissions to the Department, and accurately reflects the information as submitted by the pharmacies.  This report was requested by: Victor Madden   Reference #: [id number]   There are no results for the search terms that you entered.

## 2016-03-27 ENCOUNTER — Encounter: Payer: Self-pay | Admitting: Primary Care

## 2016-03-29 LAB — SURGICAL PATHOLOGY

## 2016-04-26 ENCOUNTER — Telehealth: Payer: Self-pay | Admitting: Primary Care

## 2016-04-26 ENCOUNTER — Ambulatory Visit: Payer: Self-pay | Admitting: Primary Care

## 2016-04-26 NOTE — Telephone Encounter (Signed)
Pt rescheduled appt from today to Monday 7/31 Has a new job and has to work all day today. He said he has off Monday so he will be here.

## 2016-04-30 ENCOUNTER — Telehealth: Payer: Self-pay | Admitting: Primary Care

## 2016-04-30 ENCOUNTER — Ambulatory Visit: Payer: Self-pay | Admitting: Primary Care

## 2016-04-30 NOTE — Telephone Encounter (Signed)
Pt rescheduled to 8/4.

## 2016-04-30 NOTE — Telephone Encounter (Signed)
Pt canceled today's appt through the answering service, lm tcb

## 2016-05-04 ENCOUNTER — Encounter: Payer: Self-pay | Admitting: Primary Care

## 2016-05-04 ENCOUNTER — Ambulatory Visit: Payer: Self-pay | Admitting: Primary Care

## 2016-05-04 VITALS — BP 132/82 | HR 88 | Ht 67.0 in | Wt 225.0 lb

## 2016-05-04 DIAGNOSIS — G8929 Other chronic pain: Secondary | ICD-10-CM

## 2016-05-04 DIAGNOSIS — E7800 Pure hypercholesterolemia, unspecified: Secondary | ICD-10-CM

## 2016-05-04 DIAGNOSIS — F5104 Psychophysiologic insomnia: Secondary | ICD-10-CM

## 2016-05-04 DIAGNOSIS — M25561 Pain in right knee: Secondary | ICD-10-CM

## 2016-05-04 DIAGNOSIS — I10 Essential (primary) hypertension: Secondary | ICD-10-CM

## 2016-05-04 NOTE — Patient Instructions (Addendum)
Colorectal for hemorrhoids and colonoscopy   Dr.Fleming [phone]  Blood test today    Schedule sleep clinic   [phone]  [address]

## 2016-05-04 NOTE — Progress Notes (Signed)
CHIEF COMPLAINT:htn    ZOX:WRUEAVWUJWJX duration for greater than a year  Severity is moderate  Course is staying the same, not worsening  Associated no recent stroke symptoms like weakness and no dyspnea    Elevated cholesterol duration for greater than a year  Severity is moderate not improving  Course is staying the same  Associated no recent myalgia or chest pain      Anxiety and sleeping is better, denies insomnia now    bw not done  Still drinking two a day, not used valium  Taking full tablet of metoprolol  Decided not to take zoloft, but filled it, pt understands  Walking on treadmill, four days a week for 30 minutes but eating more      Most recent notes,recent imaging,recent consults(media),recent bw reviewed   Vitals reviewed, medications reviewed and reconciled,problem list reviewed,allergies reviewed     Osteoarthritis got cortisone shot in June that helped for pain in right knee  PAST MEDICAL HISTORY:[facility]      FAMILY HISTORY: htn        SOCIAL HISTORY:married or with a significant other                                   Working                                 Does not smoke                                 Does not drink to excess or do drugs    ROS: no significant change in weight            No excessive fatigue            No new urinary symptoms            No new GI symptoms                     PHYSICAL EXAM: Vitals: including blood pressure as recorded  S1-S2, regular rhythm, no S3, no rub,   Lungs clear.  No masses, tenderness, organomegaly, or bruits on abdominal exam.   no calf tenderness or cords.  No edema.         ASSESS:htn  hyperchol  Right knee pain  insomnia                        PLAN:Medications were reviewed and  nochanges were made    Risks of new medication reviewed   Continue metoprolol at same dose, benefits outweigh risks, rest of meds the same.  Follow up later this year with blood work prior  Continue to work on correct diet, optimal weight and exercise          apptment to see  psych next Thursday   If starts taking zoloft let us know  Will schedule sleepclinic  Diet and exercise  Had skin tags, every year for mole check in April in dermatology       Colorectal for bpr and Meredeth Ide and hemorrhoids and screening    Current Outpatient Prescriptions   Medication Sig    metoprolol (TOPROL-XL) 50 MG 24 hr tablet 1 tablet a day    diazepam (VALIUM) 5 MG tablet Take  1 tablet (5 mg total) by mouth nightly as needed for Anxiety   Max daily dose: 5 mg     No current facility-administered medications for this visit.

## 2016-05-18 ENCOUNTER — Telehealth: Payer: Self-pay | Admitting: Primary Care

## 2016-05-18 ENCOUNTER — Encounter: Payer: Self-pay | Admitting: Primary Care

## 2016-05-18 ENCOUNTER — Other Ambulatory Visit: Payer: Self-pay | Admitting: Gastroenterology

## 2016-05-18 ENCOUNTER — Ambulatory Visit: Payer: Self-pay | Admitting: Primary Care

## 2016-05-18 VITALS — BP 132/92 | HR 82 | Temp 98.2°F | Wt 225.8 lb

## 2016-05-18 DIAGNOSIS — I10 Essential (primary) hypertension: Secondary | ICD-10-CM

## 2016-05-18 DIAGNOSIS — G4733 Obstructive sleep apnea (adult) (pediatric): Secondary | ICD-10-CM

## 2016-05-18 DIAGNOSIS — M25562 Pain in left knee: Secondary | ICD-10-CM

## 2016-05-18 DIAGNOSIS — E7800 Pure hypercholesterolemia, unspecified: Secondary | ICD-10-CM

## 2016-05-18 LAB — CBC AND DIFFERENTIAL
Baso # K/uL: 0.1 10*3/uL (ref 0.0–0.1)
Basophil %: 1 %
Eos # K/uL: 0.4 10*3/uL (ref 0.0–0.5)
Eosinophil %: 5 %
Hematocrit: 43 % (ref 40–51)
Hemoglobin: 14.5 g/dL (ref 13.7–17.5)
IMM Granulocytes #: 0 10*3/uL (ref 0.0–0.1)
IMM Granulocytes: 0.3 %
Lymph # K/uL: 1.9 10*3/uL (ref 1.3–3.6)
Lymphocyte %: 27.1 %
MCH: 29 pg/cell (ref 26–32)
MCHC: 34 g/dL (ref 32–37)
MCV: 87 fL (ref 79–92)
Mono # K/uL: 0.8 10*3/uL (ref 0.3–0.8)
Monocyte %: 10.9 %
Neut # K/uL: 3.9 10*3/uL (ref 1.8–5.4)
Nucl RBC # K/uL: 0 10*3/uL (ref 0.0–0.0)
Nucl RBC %: 0 /100 WBC (ref 0.0–0.2)
Platelets: 293 10*3/uL (ref 150–330)
RBC: 4.9 MIL/uL (ref 4.6–6.1)
RDW: 12.1 % (ref 11.6–14.4)
Seg Neut %: 55.7 %
WBC: 7 10*3/uL (ref 4.2–9.1)

## 2016-05-18 LAB — LIPID PANEL
Chol/HDL Ratio: 4.3
Cholesterol: 219 mg/dL — AB
HDL: 51 mg/dL
LDL Calculated: 107 mg/dL
Non HDL Cholesterol: 168 mg/dL
Triglycerides: 303 mg/dL — AB

## 2016-05-18 LAB — BASIC METABOLIC PANEL
Anion Gap: 14 (ref 7–16)
CO2: 26 mmol/L (ref 20–28)
Calcium: 9.7 mg/dL (ref 8.6–10.2)
Chloride: 102 mmol/L (ref 96–108)
Creatinine: 1.12 mg/dL (ref 0.67–1.17)
GFR,Black: 89 *
GFR,Caucasian: 77 *
Glucose: 106 mg/dL — ABNORMAL HIGH (ref 60–99)
Lab: 19 mg/dL (ref 6–20)
Potassium: 4.6 mmol/L (ref 3.3–5.1)
Sodium: 142 mmol/L (ref 133–145)

## 2016-05-18 LAB — URIC ACID: Urate: 6.3 mg/dL (ref 3.9–9.0)

## 2016-05-18 LAB — AST: AST: 20 U/L (ref 0–50)

## 2016-05-18 NOTE — Progress Notes (Signed)
CHIEF COMPLAINT:left knee pain    ZOX:WRUEHPI:left knee pain for three days, moderate severity, doing better, did have swelling      Three days ago felt pain in left knee and got tight and swollen and stretching in back of knee, has gotten better, has taken nothing for it.no swelling in leg and knee swollen. 50% better    Hypertension duration for greater than a year  Severity is moderate  Course is staying the same, not worsening  Associated no recent stroke symptoms like weakness and no dyspnea    Elevated cholesterol duration for greater than a year  Severity is moderate not improving  Course is staying the same  Associated no recent myalgia or chest pain      Did not start zoloft    osa pt snores has not scheduled sleep study yet    Dermatitis both axilla off an on lasts for a day or two  Most recent notes,recent imaging,recent consults(media),recent bw reviewed   Vitals reviewed, medications reviewed and reconciled,problem list reviewed,allergies reviewed       PAST MEDICAL HISTORY:anxiety      FAMILY HISTORY: htn        SOCIAL HISTORY:married or with a significant other                                   Working                                 Does not smoke                                 Does not drink to excess or do drugs    ROS: no significant change in weight            No excessive fatigue            No new urinary symptoms            No new GI symptoms                     PHYSICAL EXAM: Vitals: including blood pressure as recorded  S1-S2, regular rhythm, no S3, no rub,   Lungs clear.  No masses, tenderness, organomegaly, or bruits on abdominal exam.   no calf tenderness or cords.  No edema. Left knee with tenderness laterally at joint line, small fluid, probable bakers cyst, minimal tenderness. Dermatitis under arm        ASSESS:left knee pain  htn  hyperchol  osa                        PLAN:Medications were reviewed and no changes were made    Risks of new medication reviewed   Continue metoprolol xl at same  dose, benefits outweigh risks, rest of meds the same.  Follow up later this year with blood work prior  Continue to work on correct diet, optimal weight and exercise        vni today  Xray of left knee   uric acid level  Increase metoprolol tow two a day      Colorectal for bpr and hemorrhoids  Diet and exercise  Sleep clinic  Seeing psych again next week, did not start meds  Current Outpatient Prescriptions   Medication Sig    metoprolol (TOPROL-XL) 50 MG 24 hr tablet Two a day    diazepam (VALIUM) 5 MG tablet Take 1 tablet (5 mg total) by mouth nightly as needed for Anxiety   Max daily dose: 5 mg     No current facility-administered medications for this visit.

## 2016-05-18 NOTE — Telephone Encounter (Signed)
Pt had another question for you. He would like it if you can call him.

## 2016-05-18 NOTE — Telephone Encounter (Signed)
Pt is negative for DVT in left leg but has a baker cyst behind the knee

## 2016-05-18 NOTE — Telephone Encounter (Signed)
Returned call and left message.

## 2016-05-18 NOTE — Patient Instructions (Addendum)
Blood test on Saturday and tell the lab to do all the blood tests that are in the computer for you  Venous non invasive on left leg today  Xray of left knee today for knee pain  Increase metoprolol to two a day        Sleep clinic , colorectal

## 2016-05-21 LAB — HSV 2 ANTIBODY, IGG: HSV 2 IgG: 0.13 IV

## 2016-05-21 LAB — HSV 1 ANTIBODY, IGG: HSV 1 IgG: 11.2 IV

## 2016-05-22 ENCOUNTER — Telehealth: Payer: Self-pay | Admitting: Primary Care

## 2016-05-22 NOTE — Telephone Encounter (Signed)
Pt given the number to call Dr. Terance HartBronstein ( [phone]) Pt stated he will call today

## 2016-05-22 NOTE — Telephone Encounter (Signed)
Please set up with ortho Dr.Bronstein for knee pain with knee effusion

## 2016-06-01 ENCOUNTER — Ambulatory Visit: Payer: Self-pay | Admitting: Orthopedic Surgery

## 2016-06-01 ENCOUNTER — Encounter: Payer: Self-pay | Admitting: Orthopedic Surgery

## 2016-06-01 VITALS — BP 140/78 | Ht 67.0 in | Wt 225.0 lb

## 2016-06-01 DIAGNOSIS — M25562 Pain in left knee: Secondary | ICD-10-CM

## 2016-06-01 NOTE — Progress Notes (Signed)
He is doing fine with his right knee following cortisone injection in June.  He presents today for my consultation for problem with his left knee.  On August 15 he was bowling and developed an effusion of the left knee.  This is been improving.  He presents for my consultation.    Past medical history otherwise significant for hypertension and right knee arthritis    Past surgical history negative    Medications: Metoprolol    No known drug allergies    Review of systems otherwise negative    Physical examination left knee: There is no anterior fusion although there is a palpable popliteal cyst.  Range of motion 0-120.  He has no tenderness over the quadriceps tendon, patella, patellar tendon, or tibial tuberosity.  He has no medial or lateral patellar facet tenderness.  He has no medial or lateral joint line tenderness.  The knee is stable to varus and valgus stress with negative Lachman examination negative posterior drawer.  On circumduction maneuvers within his available range he has medial pain.  No lateral symptoms circumduction.    I did review again weightbearing radiographs that were performed last year of both knees.  They do show some mild decreased medial joint space as well as decreased lateral patellofemoral joint space.    He had recent radiographs that I reviewed with her nonweightbearing and do not include a tunnel or Zoila Shutterosenberg view.  No acute changes.    Impression: He does have underlying degenerative disease of the left knee.  This is not as bad as his contralateral knee.  He could have meniscal pathology of a degenerative meniscus tear.  Symptoms are improving.    Plan: I reviewed this with him.  As his symptoms are improving wait this out for now and see how he does.  I will check him back in about 3 weeks.  Questions are answered and a large portion of today's visit was spent education and counseling of the patient.    This note was transcribed using voice recognition software.  Please excuse  irregularities that may result.

## 2016-06-08 ENCOUNTER — Ambulatory Visit: Payer: Self-pay | Admitting: Primary Care

## 2016-06-08 ENCOUNTER — Encounter: Payer: Self-pay | Admitting: Primary Care

## 2016-06-08 VITALS — BP 123/83 | HR 102 | Ht 67.0 in | Wt 224.0 lb

## 2016-06-08 DIAGNOSIS — M25562 Pain in left knee: Secondary | ICD-10-CM

## 2016-06-08 DIAGNOSIS — E7800 Pure hypercholesterolemia, unspecified: Secondary | ICD-10-CM

## 2016-06-08 DIAGNOSIS — I10 Essential (primary) hypertension: Secondary | ICD-10-CM

## 2016-06-08 DIAGNOSIS — K921 Melena: Secondary | ICD-10-CM

## 2016-06-08 NOTE — Patient Instructions (Addendum)
Call sleep clinic- [facility]- [phone]  Colorectal for hematochezia Dr. Meredeth IdeFleming- [phone]

## 2016-06-08 NOTE — Progress Notes (Signed)
CHIEF COMPLAINT:htn    AVW:UJWJXBJYNWGNHPI:Hypertension duration for greater than a year  Severity is moderate  Course is staying the same, not worsening  Associated no recent stroke symptoms like weakness and no dyspnea    Elevated cholesterol duration for greater than a year  Severity is moderate not improving  Course is staying the same  Associated no recent myalgia or chest pain            bpr has stopped, only that one episode, none since, no lightheaded    Saw ortho for left knee pain, is improving and will see again. Had xray  bw reviewed          Most recent notes,recent imaging,recent consults(media),recent bw reviewed   Vitals reviewed, medications reviewed and reconciled,problem list reviewed,allergies reviewed       PAST MEDICAL HISTORY:htn      FAMILY HISTORY: htn        SOCIAL HISTORY:married or with a significant other                                   Working                                 Does not smoke                                 Does not drink to excess or do drugs    ROS: no significant change in weight            No excessive fatigue            No new urinary symptoms            No new GI symptoms                     PHYSICAL EXAM: Vitals: including blood pressure as recorded  S1-S2, regular rhythm, no S3, no rub,   Lungs clear.  No masses, tenderness, organomegaly, or bruits on abdominal exam.   no calf tenderness or cords.  No edema.         ASSESS:htn  Knee pain left        hematochezia      hyperchol       Pt aware of need for weight loss    PLAN:Medications were reviewed and no changes were made    Risks of new medication reviewed   Continue metoprolol at same dose, benefits outweigh risks, rest of meds the same.  Follow up later this year with blood work prior  Continue to work on [employer]correct diet, optimal weight and exercise          Saw psychiatrist, Dr. Arnoldo MoraleManley, went well, did not start on meds, currently still drinking, discussed with psych. Pt says drinks because of anxiety.  Still awaiting   Medications from psych.will see again.in two weeks    Will call sleep clinic  Ortho follow up scheduled  Will call colorectal, explained why to pt    Current Outpatient Prescriptions   Medication Sig    metoprolol (TOPROL-XL) 50 MG 24 hr tablet Two a day    diazepam (VALIUM) 5 MG tablet Take 1 tablet (5 mg total) by mouth nightly as needed for Anxiety   Max daily dose: 5 mg  No current facility-administered medications for this visit.

## 2016-06-29 ENCOUNTER — Ambulatory Visit: Payer: Self-pay | Admitting: Orthopedic Surgery

## 2016-09-10 ENCOUNTER — Telehealth: Payer: Self-pay | Admitting: Primary Care

## 2016-09-10 NOTE — Telephone Encounter (Signed)
Pt was asking if he can start taking the sertraline that had been ordered in the past. Stated he has not started taking this yet but thinks he should and needs a refill due to it being expired.

## 2016-09-10 NOTE — Telephone Encounter (Signed)
See if he can come in on Tuesday

## 2016-09-10 NOTE — Telephone Encounter (Signed)
Pt coming in tomorrow

## 2016-09-11 ENCOUNTER — Ambulatory Visit: Payer: Self-pay | Admitting: Primary Care

## 2016-09-13 ENCOUNTER — Ambulatory Visit: Payer: Self-pay | Admitting: Surgery

## 2016-09-20 ENCOUNTER — Ambulatory Visit: Payer: Self-pay | Admitting: Surgery

## 2016-09-21 ENCOUNTER — Ambulatory Visit: Payer: Self-pay | Admitting: Primary Care

## 2016-09-21 ENCOUNTER — Other Ambulatory Visit: Payer: Self-pay | Admitting: Primary Care

## 2016-09-21 ENCOUNTER — Encounter: Payer: Self-pay | Admitting: Primary Care

## 2016-09-21 VITALS — BP 128/84 | HR 94 | Ht 67.0 in | Wt 227.0 lb

## 2016-09-21 DIAGNOSIS — F3289 Other specified depressive episodes: Secondary | ICD-10-CM

## 2016-09-21 DIAGNOSIS — F419 Anxiety disorder, unspecified: Secondary | ICD-10-CM

## 2016-09-21 DIAGNOSIS — E7800 Pure hypercholesterolemia, unspecified: Secondary | ICD-10-CM

## 2016-09-21 DIAGNOSIS — I10 Essential (primary) hypertension: Secondary | ICD-10-CM

## 2016-09-21 DIAGNOSIS — K921 Melena: Secondary | ICD-10-CM

## 2016-09-21 MED ORDER — SERTRALINE HCL 25 MG PO TABS *I*
25.0000 mg | ORAL_TABLET | Freq: Every day | ORAL | 5 refills | Status: DC
Start: 2016-09-21 — End: 2017-02-07

## 2016-09-21 MED ORDER — DIAZEPAM 5 MG PO TABS *I*
5.0000 mg | ORAL_TABLET | Freq: Every evening | ORAL | 0 refills | Status: DC | PRN
Start: 2016-09-21 — End: 2017-03-28

## 2016-09-21 NOTE — Progress Notes (Signed)
CHIEF COMPLAINT:htn    GNF:AOZHYQMVHQIOHPI:Hypertension duration for greater than a year  Severity is moderate  Course is staying the same, not worsening  Associated no recent stroke symptoms like weakness and no dyspnea  In context of trying to lose weight    Elevated cholesterol duration for greater than a year  Severity is moderate not improving  Course is staying the same  Associated no recent myalgia or chest pain  In context of trying to exercise      etoh use for years drinking 4 a day now    Anxiety and depression is getting worst, no suicidal ideation    Saw psychiatrist and he left the practice      Hematochezia has resolved, without rectal pain, scheduled to see colorectal            Most recent notes,recent imaging,recent consults(media),recent bw reviewed   Vitals reviewed, medications reviewed and reconciled,problem list reviewed,allergies reviewed       PAST MEDICAL HISTORY:[employer]      FAMILY HISTORY: htn        SOCIAL HISTORY:married or with a significant other                                   Working                                 Does not smoke                                 Does not drink to excess or do drugs    ROS: no significant change in weight            No excessive fatigue            No new urinary symptoms            No new GI symptoms                     PHYSICAL EXAM: Vitals: including blood pressure as recorded  S1-S2, regular rhythm, no S3, no rub,   Lungs clear.  No masses, tenderness, organomegaly, or bruits on abdominal exam.   no calf tenderness or cords.  No edema.         ASSESS:htn  hyperchol  Depression  hematochezia                No suicidal or homicidal ideation, stopped seeing psychiatrist        PLAN:Medications were reviewed and  changes were made  Risks of new medication reviewed   Continue metoprolol at same dose, benefits outweigh risks, rest of meds the same.  Follow up later next year with blood work prior  Continue to work on Home [employer]otcorrect diet, optimal weight and  exercise        Seeing colorectal in January  Still thinking about sleep clinic    Continue metoprolol  Valium 5 mg  At night as needed #10  Warnings on  Meds, will use valium only at night and no driving  zoloft 25 mg once a day,  Call me in two weeks with an update    Pt knows not to drink etoh with meds    Current Outpatient Prescriptions   Medication Sig    sertraline (ZOLOFT) 25  MG tablet Take 1 tablet (25 mg total) by mouth daily    diazepam (VALIUM) 5 MG tablet Take 1 tablet (5 mg total) by mouth nightly as needed for Anxiety   Max daily dose: 5 mg    metoprolol (TOPROL-XL) 50 MG 24 hr tablet Two a day     No current facility-administered medications for this visit.

## 2016-09-21 NOTE — Telephone Encounter (Signed)
Prescription Reports  You just saved 19.95 seconds of typing!   Victor GripScot Grider - 1 Prescription  Confidential Drug Report  Search Terms: Victor GripScot Bre[place], 11/12/1965   Search Date: 09/21/2016 12:30:26 PM   Searching on behalf of: [id number] - [facility]   The Drug Utilization Report below displays all of the controlled substance prescriptions, if any, that your patient has filled in the last twelve months. The information displayed on this report is compiled from pharmacy submissions to the Department, and accurately reflects the information as submitted by the pharmacies.  This report was requested by: Alexander Bergeronobin Shatika Grinnell   Reference #: [id number]   Others' Prescriptions  Patient Name: Victor Madden Birth Date: 11/12/1965   Address: [address] [place], [place] [id number] Sex: Male   Rx Written Rx Dispensed Drug Quantity Days Supply Prescriber Name Payment Method Dispenser   03/26/2016 03/26/2016 diazepam 5 mg tablet  7 7 Kriste BasqueSuozzi, Anthony J MD [employer] [facility] [phone]   * - Drugs marked with an asterisk are compound drugs. If the compound drug is made up of more than one controlled substance, then each controlled substance will be a separate row in the table.      Report Suspicious Activity   Send Questions/Comments   Substance Abuse Treatment Information     Click the Report Suspicious Activity button to report information related to controlled substance suspicious activity to the [employer].   Click the Send Questions/Comments button to send questions about this report to the [facility] of [facility]otic [facility], or call [phone].   Click the Substance Abuse Treatment Information button to go to the Office of Alcoholism and Substance Abuse Services website, www.oasas.[employer].huny.gov or call 615-878-20591-617-758-3584.

## 2016-10-04 ENCOUNTER — Ambulatory Visit: Payer: Self-pay | Admitting: Surgery

## 2016-10-04 ENCOUNTER — Encounter: Payer: Self-pay | Admitting: Surgery

## 2016-10-04 VITALS — BP 140/96 | HR 108 | Temp 97.4°F | Ht 67.0 in | Wt 228.7 lb

## 2016-10-04 DIAGNOSIS — K625 Hemorrhage of anus and rectum: Secondary | ICD-10-CM

## 2016-10-04 NOTE — H&P (Signed)
History and Physical    HISTORY:  Chief Complaint   Patient presents with    New Patient Visit         History of Present Illness:    HPI:  Arline AspScot is a 51 year old male seen today for further evaluation of rectal bleeding.  He reports suffering 2 events of hematochezia in April 2017 [place] from [place] that has not recurred.  He has rare "flecks" of blood on toilet tissue only a few times per week with known hemorrhoids.  He denies nausea, abdominal and/or rectal pain.  He defecates 3-4 formed stools per day without straining.   He has never had a colonoscopy completed and denies a family history of colon or rectal cancer.  His appetite and energy are stable.      Problems:  Patient Active Problem List   Diagnosis Code    Anxiety F41.1    Hypertension I10    Hyperlipidemia E78.5    Depression F32.9    Attention Deficit Disorder Without Hyperactivity F90.9    Warts B07.9        Past Medical/Surgical History:   No past medical history on file.  Past Surgical History:   Procedure Laterality Date    KNEE SURGERY      Knee Surgery Conversion Data          Allergies:    Allergies   Allergen Reactions    No Known Drug Allergy      Created by Conversion - 0;        Current medications:    Current Outpatient Prescriptions   Medication Sig    sertraline (ZOLOFT) 25 MG tablet Take 1 tablet (25 mg total) by mouth daily    diazepam (VALIUM) 5 MG tablet Take 1 tablet (5 mg total) by mouth nightly as needed for Anxiety   Max daily dose: 5 mg    metoprolol (TOPROL-XL) 50 MG 24 hr tablet Two a day       Family History:    Family History   Problem Relation Age of Onset    Conversion Other      [id number]^Prostate Cancer^V16.42^Active^    Conversion Other      [id number]^Hypertension^401.9^Active^    High Blood Pressure Mother        Social/Occupational History:   Social History     Social History    Marital status: Married     Spouse name: N/A    Number of children: N/A    Years of education: N/A     Social  History Main Topics    Smoking status: Never Smoker    Smokeless tobacco: Never Used    Alcohol use Not on file    Drug use: Not on file    Sexual activity: Not on file     Other Topics Concern    Not on file     Social History Narrative         Review of Systems:    Review of Systems   Constitutional: Negative for chills, fever, malaise/fatigue and weight loss.   HENT: Positive for tinnitus. Negative for ear pain and hearing loss.    Eyes: Negative for blurred vision, double vision and photophobia.   Respiratory: Negative for cough.    Cardiovascular: Negative for chest pain, palpitations, orthopnea and PND.   Gastrointestinal: Positive for blood in stool and heartburn. Negative for abdominal pain, nausea and vomiting.   Genitourinary: Negative for dysuria, frequency and urgency.  Musculoskeletal: Negative for myalgias.   Skin: Negative for itching and rash.   Neurological: Negative for dizziness, tingling, tremors and headaches.   Psychiatric/Behavioral: Positive for depression.       Vital Signs:   BP (!) 140/96   Pulse 108   Temp 36.3 C (97.4 F) (Temporal)    Ht 1.702 m (5\' 7" )   Wt 103.7 kg (228 lb 11.2 oz)   SpO2 94%   BMI 35.82 kg/m2      PHYSICAL EXAM:  Physical Exam   Constitutional: He appears well-developed and well-nourished.   HENT:   Head: Normocephalic and atraumatic.   Mouth/Throat: No oropharyngeal exudate.   Eyes: No scleral icterus.   Cardiovascular: Normal rate, regular rhythm and normal heart sounds.    No murmur heard.  Pulmonary/Chest: Effort normal and breath sounds normal. He has no wheezes.   Abdominal: Soft. Bowel sounds are normal. There is no tenderness.   Musculoskeletal: He exhibits no edema.   Neurological: He is alert.   Skin: Skin is warm and dry.   Psychiatric:   Admits to increased anxiety today           Assessment:     Pleasant, but nervous 51 year old male seen today for further evaluation of rectal bleeding.   Plan:      It was my pleasure to meet this most  pleasant,but highly anxious man today. His clinical history is quite suggestive of a hemorrhoidal related bleeding today. He declined a rectal exam today due to anxiety. Notwithstanding the likely benign nature of the process, I would recommend a colonoscopy given his history and age. I would propose to do this with anesthesia input due to his alcohol consumption (3-4 beers a night per patient) which in addition to his anxiety may make achieving sedation with fentanyl and versed alone problematic. Mr Anthoney Harada is agreeable to this.     Many thanks for the opportunity to participate in the care of your patient. Please do not hesitate to contact me if you have any questions or concerns.    Kind regards,     Matilde Sprang, M.D.

## 2016-10-05 ENCOUNTER — Other Ambulatory Visit: Payer: Self-pay | Admitting: Primary Care

## 2016-10-05 DIAGNOSIS — Z0000 Encounter for general adult medical examination without abnormal findings: Secondary | ICD-10-CM

## 2016-10-05 DIAGNOSIS — I10 Essential (primary) hypertension: Secondary | ICD-10-CM

## 2016-10-05 DIAGNOSIS — E785 Hyperlipidemia, unspecified: Secondary | ICD-10-CM

## 2016-10-06 ENCOUNTER — Encounter: Payer: Self-pay | Admitting: Surgery

## 2016-10-08 ENCOUNTER — Encounter: Payer: Self-pay | Admitting: Orthopedic Surgery

## 2016-10-08 ENCOUNTER — Ambulatory Visit: Payer: Self-pay | Admitting: Orthopedic Surgery

## 2016-10-08 ENCOUNTER — Other Ambulatory Visit: Payer: Self-pay | Admitting: Primary Care

## 2016-10-08 VITALS — BP 143/93 | Ht 67.0 in | Wt 225.0 lb

## 2016-10-08 DIAGNOSIS — M1712 Unilateral primary osteoarthritis, left knee: Secondary | ICD-10-CM

## 2016-10-08 MED ORDER — METOPROLOL SUCCINATE 50 MG PO TB24 *I*
ORAL_TABLET | ORAL | 5 refills | Status: DC
Start: 2016-10-08 — End: 2016-11-09

## 2016-10-08 NOTE — Progress Notes (Signed)
He presents for follow-up right knee osteoarthritis.  He had a cortisone injection in June which he felt helped quite a bit is asking for another one.  Review this process.  I did review that if this injection helped for several months that it is okay to repeated at this time.  He went to go ahead with this.    Following a proper timeout, under sterile conditions with Betadine preparation the right knee is injected with lidocaine and 6 mg of Celestone.  He tolerated the procedure well.    As needed follow-up.    This note was transcribed using voice recognition software.  Please excuse irregularities that may result.

## 2016-10-12 ENCOUNTER — Telehealth: Payer: Self-pay | Admitting: Primary Care

## 2016-10-12 NOTE — Telephone Encounter (Signed)
Pt rescheduled physical to 2/6 from 1/23

## 2016-10-23 ENCOUNTER — Ambulatory Visit: Payer: Self-pay | Admitting: Primary Care

## 2016-10-23 ENCOUNTER — Encounter: Payer: Self-pay | Admitting: Primary Care

## 2016-11-01 ENCOUNTER — Telehealth: Payer: Self-pay | Admitting: Primary Care

## 2016-11-01 NOTE — Telephone Encounter (Signed)
Pt changed phy appt from 2/6 to 3/2

## 2016-11-06 ENCOUNTER — Encounter: Payer: Self-pay | Admitting: Primary Care

## 2016-11-09 ENCOUNTER — Ambulatory Visit: Payer: Self-pay | Admitting: Primary Care

## 2016-11-09 ENCOUNTER — Other Ambulatory Visit: Payer: Self-pay | Admitting: Primary Care

## 2016-11-09 MED ORDER — METOPROLOL SUCCINATE 50 MG PO TB24 *I*
ORAL_TABLET | ORAL | 0 refills | Status: DC
Start: 2016-11-09 — End: 2017-02-03

## 2016-11-19 ENCOUNTER — Telehealth: Payer: Self-pay | Admitting: Surgery

## 2016-11-19 NOTE — Telephone Encounter (Signed)
Patient calling to reschedule Scope 12/19/16 with Dr. Meredeth IdeFleming.  He Just started new job .  Patient can be reached at  [phone]

## 2016-11-21 NOTE — Telephone Encounter (Signed)
Cancelled colonoscopy as per pt`s request and LVM for them to call me back to reschedule at their convenience.

## 2016-11-30 ENCOUNTER — Encounter: Payer: Self-pay | Admitting: Primary Care

## 2016-12-19 ENCOUNTER — Ambulatory Visit: Admission: RE | Admit: 2016-12-19 | Payer: PRIVATE HEALTH INSURANCE | Source: Ambulatory Visit | Admitting: Surgery

## 2016-12-19 SURGERY — COLONOSCOPY

## 2017-01-14 ENCOUNTER — Telehealth: Payer: Self-pay | Admitting: Primary Care

## 2017-01-14 NOTE — Telephone Encounter (Signed)
Please get chest  xray or any other imaging that pt had done at [facility] on 4/15, [facility]

## 2017-01-15 NOTE — Telephone Encounter (Signed)
done

## 2017-01-28 ENCOUNTER — Encounter (INDEPENDENT_AMBULATORY_CARE_PROVIDER_SITE_OTHER): Payer: Self-pay

## 2017-02-03 ENCOUNTER — Other Ambulatory Visit: Payer: Self-pay | Admitting: Neurology

## 2017-02-06 ENCOUNTER — Other Ambulatory Visit
Admission: RE | Admit: 2017-02-06 | Discharge: 2017-02-06 | Disposition: A | Discharge status: Home / Self Care | Payer: PRIVATE HEALTH INSURANCE | Source: Ambulatory Visit | Attending: Primary Care | Admitting: Primary Care

## 2017-02-06 DIAGNOSIS — Z0000 Encounter for general adult medical examination without abnormal findings: Secondary | ICD-10-CM | POA: Insufficient documentation

## 2017-02-06 DIAGNOSIS — E785 Hyperlipidemia, unspecified: Secondary | ICD-10-CM | POA: Insufficient documentation

## 2017-02-06 DIAGNOSIS — I10 Essential (primary) hypertension: Secondary | ICD-10-CM

## 2017-02-06 LAB — CBC AND DIFFERENTIAL
Baso # K/uL: 0.1 10*3/uL (ref 0.0–0.1)
Basophil %: 0.7 %
Eos # K/uL: 0.5 10*3/uL (ref 0.0–0.5)
Eosinophil %: 7.5 %
Hematocrit: 44 % (ref 40–51)
Hemoglobin: 14.6 g/dL (ref 13.7–17.5)
IMM Granulocytes #: 0 10*3/uL (ref 0.0–0.1)
IMM Granulocytes: 0.1 %
Lymph # K/uL: 2 10*3/uL (ref 1.3–3.6)
Lymphocyte %: 28.5 %
MCH: 30 pg/cell (ref 26–32)
MCHC: 33 g/dL (ref 32–37)
MCV: 90 fL (ref 79–92)
Mono # K/uL: 0.7 10*3/uL (ref 0.3–0.8)
Monocyte %: 10.1 %
Neut # K/uL: 3.7 10*3/uL (ref 1.8–5.4)
Nucl RBC # K/uL: 0 10*3/uL (ref 0.0–0.0)
Nucl RBC %: 0 /100 WBC (ref 0.0–0.2)
Platelets: 303 10*3/uL (ref 150–330)
RBC: 4.9 MIL/uL (ref 4.6–6.1)
RDW: 12.1 % (ref 11.6–14.4)
Seg Neut %: 53.1 %
WBC: 7 10*3/uL (ref 4.2–9.1)

## 2017-02-06 LAB — LIPID PANEL
Chol/HDL Ratio: 5
Cholesterol: 206 mg/dL — AB
HDL: 41 mg/dL
LDL Calculated: 104 mg/dL
Non HDL Cholesterol: 165 mg/dL
Triglycerides: 303 mg/dL — AB

## 2017-02-06 LAB — BASIC METABOLIC PANEL
Anion Gap: 14 (ref 7–16)
CO2: 23 mmol/L (ref 20–28)
Calcium: 9.5 mg/dL (ref 8.6–10.2)
Chloride: 103 mmol/L (ref 96–108)
Creatinine: 1.05 mg/dL (ref 0.67–1.17)
GFR,Black: 96 *
GFR,Caucasian: 83 *
Glucose: 124 mg/dL — ABNORMAL HIGH (ref 60–99)
Lab: 17 mg/dL (ref 6–20)
Potassium: 4.2 mmol/L (ref 3.3–5.1)
Sodium: 140 mmol/L (ref 133–145)

## 2017-02-07 ENCOUNTER — Telehealth: Payer: Self-pay | Admitting: Primary Care

## 2017-02-07 ENCOUNTER — Encounter: Payer: Self-pay | Admitting: Gastroenterology

## 2017-02-07 ENCOUNTER — Ambulatory Visit: Payer: PRIVATE HEALTH INSURANCE | Attending: Primary Care | Admitting: Primary Care

## 2017-02-07 ENCOUNTER — Encounter: Payer: Self-pay | Admitting: Primary Care

## 2017-02-07 VITALS — BP 124/76 | HR 88 | Ht 67.0 in | Wt 215.0 lb

## 2017-02-07 DIAGNOSIS — E7800 Pure hypercholesterolemia, unspecified: Secondary | ICD-10-CM

## 2017-02-07 DIAGNOSIS — I10 Essential (primary) hypertension: Secondary | ICD-10-CM

## 2017-02-07 DIAGNOSIS — Z0000 Encounter for general adult medical examination without abnormal findings: Secondary | ICD-10-CM

## 2017-02-07 NOTE — H&P (Signed)
Chief complaint:htn    ZOX:[id number]:Hypertension duration for greater than a year  Severity is moderate  Course is staying the same, not worsening  Associated no recent stroke symptoms like weakness and no dyspnea  In context of trying to lose weight    Elevated cholesterol duration for greater than a year  Severity is moderate not improving  Course is staying the same  Associated no recent myalgia or chest pain  In context of trying to exercise          Blood test noted, was non fasting for eight hours  Not taking valium and not taking zoloft  Lost weight on purpose, mostly with decrease in etoh and more active  Saw colorectal,no further bleeding, scheduled for colonoscopy    Knee pain right, got cortisone shot and it helped  Most recent notes,recent imaging,recent consults(media),recent bw reviewed   Vitals reviewed, medications reviewed and reconciled,problem list reviewed,allergies reviewed     Fell and bruised ribs  Routine eye exam recently ok atp    Sees derm yearly , Dr. Hyacinth MeekerMiller, saw within last month for skin check  Past medical history: anxiety, htn, hyperlipidemia, depression, ADD , warts        Family history: Diabetes no       hypertension yes        coronary artery disease no           cancer no    Social history:  Married or with a significant other, working, does not exercise regularly, occasionally watches diet, does not smoke, does not drink to excess, does not do drugs, never did intravenous drugs, is  in a monogamous relationship, sees the dentist regularly, and wears seatbelts.    Review of systems: No chest pain or dyspnea, no blood or black stools, no urinary symptoms that are new, no stroke or seizure, no constitutional symptoms, no polyuria or polydipsia, no problems with the blood or lymph glands, no significantly new problems with the eyes ears nose throat or skin, no new orthopedic problems, psychologically stable. No excessive snoring or symptoms of sleep apnea. No significant urinary  incontinence.and aware of rx options.   Medications reviewed and reconciled in computer today.  Most recent blood work reviewed   Physical exam: Weight:   Vitals including blood pressure as recorded pr88  Murmur   no       regular rhythm  S1-S2 no S3 no rub PMI nondisplaced.  Jugular venous pressure approximately 7 cm.  No new edema , no calf tenderness, or cords.  Lungs clear to auscultation.  No masses, tenderness, organomegaly or bruits and normal bowel sounds.  Cranial nerves, pronator drift, finger-to-nose, light touch, reflexes all equal normal bilaterally.  Acuity, extraocular movements light reflex, ophthalmologic exam all equal and unchanged from previous and without significant abnormality.  Hearing equal bilaterally TMs normal and canals unremarkable.  Nose unremarkable lymph gland cervical axillary supraclavicular groin all unremarkable.  Skin unremarkable, feet unremarkable and no hernias.  Pulses equal.  No significant bruits.  thyroid unchanged. Mouth and throat unremarkable.   BJY:NWGNFAOZHGen:Testicles and scrotum were unremarkable, there were no hernias, prostate was normal for age without any nodules suggestive of cancer, rectal was unremarkable. Breasts unremarkable.     Pelvic, rectal and breast were deferred per patient request        EKG was reviewed and  Rate 77    As per ekg reading , rhythm NSR,  Axis normal 18, no significant change from prior  ASSESS:annual physical  htn  hyperchol    Pt aware of need for weight loss    PLAN:Medications were reviewed and no changes were made            Risks of new meds reviewed   Continue metoprolol at same dose, benefits outweigh risks, rest of meds the same.  TSE Known    Follow up later next year with blood work prior   Continue to work on correct diet, optimal weight and exercise         Glu on blood test reviewed,       Sleep apnea has not gone yet, knows he should      Never smoked     Risk 4.5 % hold on meds    Blood test recheck sugar prior to leaving  to [employer] to [place] in August    Colonoscopy this summer      Derm yearly for DN       Shingrix handout for age 89 and repeat in 2-6 months        Current Outpatient Prescriptions   Medication Sig    metoprolol (TOPROL-XL) 50 MG 24 hr tablet TAKE 2 TABLETS BY MOUTH EVERY DAY    diazepam (VALIUM) 5 MG tablet Take 1 tablet (5 mg total) by mouth nightly as needed for Anxiety   Max daily dose: 5 mg     No current facility-administered medications for this visit.

## 2017-02-07 NOTE — Telephone Encounter (Signed)
Please get most recent note from Dr. Hyacinth MeekerMiller , dermatologist on this pt.

## 2017-02-07 NOTE — Patient Instructions (Signed)
Exercise, aerobic 160 minutes a week, optimally, seven days for at least 45- one hour also do resistance    Diet: Mediterranean Diet    Mindfull Meditation  20 minutes(dark room, by yourself, sitting straight, relaxing muscles, breathe in cool air in nose,abdominal breathing,breathe warm exhale longer than inhale)

## 2017-02-07 NOTE — Telephone Encounter (Signed)
Pt had xray of ribs or chest or both about a month ago at a [place] [facility] after hours. Please try to obtain those records.

## 2017-02-08 NOTE — Telephone Encounter (Signed)
Requested.

## 2017-02-08 NOTE — Telephone Encounter (Signed)
Requested times 2 from [place] at Dr. Hyacinth MeekerMiller office, stated it will be this afternoon once the nurse and MD return from the surgery suite. [phone]( [phone])

## 2017-02-08 NOTE — Telephone Encounter (Signed)
Under media 4/26

## 2017-03-20 ENCOUNTER — Ambulatory Visit: Admit: 2017-03-20 | Payer: Self-pay | Admitting: Surgery

## 2017-03-20 SURGERY — COLONOSCOPY

## 2017-03-28 ENCOUNTER — Ambulatory Visit: Payer: PRIVATE HEALTH INSURANCE | Attending: Primary Care | Admitting: Primary Care

## 2017-03-28 ENCOUNTER — Encounter: Payer: Self-pay | Admitting: Primary Care

## 2017-03-28 ENCOUNTER — Other Ambulatory Visit: Payer: Self-pay | Admitting: Primary Care

## 2017-03-28 VITALS — BP 132/86 | HR 98 | Ht 67.0 in | Wt 218.0 lb

## 2017-03-28 DIAGNOSIS — F419 Anxiety disorder, unspecified: Secondary | ICD-10-CM

## 2017-03-28 DIAGNOSIS — I10 Essential (primary) hypertension: Secondary | ICD-10-CM

## 2017-03-28 DIAGNOSIS — E7800 Pure hypercholesterolemia, unspecified: Secondary | ICD-10-CM

## 2017-03-28 DIAGNOSIS — M25512 Pain in left shoulder: Secondary | ICD-10-CM

## 2017-03-28 MED ORDER — DIAZEPAM 5 MG PO TABS *I*
5.0000 mg | ORAL_TABLET | Freq: Every evening | ORAL | 0 refills | Status: DC | PRN
Start: 2017-03-28 — End: 2023-06-04

## 2017-03-28 NOTE — Telephone Encounter (Signed)
Prescription Reports  You just saved 19.95 seconds of typing!   Please direct any questions regarding prescription data displayed on the PMP Registry to the dispensing pharmacy. The dispenser is the source of all data presented. Only the dispenser can confirm or modify the data reported to, and displayed on, the PMP Registry.   Victor GripScot Bozarth - 1 Prescription  Confidential Drug Report  Search Terms: Victor Madden, [birth year]   Search Date: 03/28/2017 02:34:27 PM   Searching on behalf of: [id number] - Donella [facility]   The Drug Utilization Report below displays all of the controlled substance prescriptions, if any, that your patient has filled in the last twelve months. The information displayed on this report is compiled from pharmacy submissions to the Department, and accurately reflects the information as submitted by the pharmacies.  This report was requested by: Alexander Bergeronobin Jessie Schrieber   Reference #: [id number]   Others' Prescriptions  Patient Name: Victor Madden Birth Date: [birth year]   Address: [address] [place], [place] [id number] Sex: Male   Rx Written Rx Dispensed Drug Quantity Days Supply Prescriber Name Payment Method Dispenser   09/21/2016 09/22/2016 diazepam 5 mg tablet  10 10 Kriste BasqueSuozzi, Anthony J MD [employer] [facility] [phone]   * - Drugs marked with an asterisk are compound drugs. If the compound drug is made up of more than one controlled substance, then each controlled substance will be a separate row in the table.      Report Suspicious Activity   Send Questions/Comments   Substance Abuse Treatment Information     Click the Report Suspicious Activity button to report information related to controlled substance suspicious activity to the [employer].   Click the Send Questions/Comments button to send questions about this report to the [facility]otic [facility], or call [phone].   Click the Substance Abuse Treatment Information button to go to the Office of  Alcoholism and Substance Abuse Services website, www.oasas.[employer].huny.gov or call [phone].    2018 UR

## 2017-03-28 NOTE — Progress Notes (Signed)
CHIEF COMPLAINT:htn    VQQ:[id number]:Hypertension duration for greater than a year  Severity is moderate  Course is staying the same, not worsening  Associated no recent stroke symptoms like weakness and no dyspnea  In context of trying to lose weight    Elevated cholesterol duration for greater than a year  Severity is moderate not improving  Course is staying the same  Associated no recent myalgia or chest pain  In context of trying to exercise      Anxiety for years, doing about the same, has not used valium in years but still wishes to have some in an emergency    On May 17th moving things at house at [employer], fell off [employer] truck on left shoulder, improving slowly, xrays , unremarkable and was told bruised. 4/10 pain    Ribs better but still uncomfortable especially since fell  Cancelled colonoscopy , was scheduled        Most recent notes,recent imaging,recent consults(media),recent bw reviewed   Vitals reviewed, medications reviewed and reconciled,problem list reviewed,allergies reviewed       PAST MEDICAL HISTORY:depression      FAMILY HISTORY: htn        SOCIAL HISTORY:married or with a significant other                                   Working                                 Does not smoke                                 Does not drink to excess or do drugs    ROS: no significant change in weight            No excessive fatigue            No new urinary symptoms            No new GI symptoms                     PHYSICAL EXAM: Vitals: including blood pressure as recorded  S1-S2, regular rhythm, no S3, no rub,   Lungs clear.  No masses, tenderness, organomegaly, or bruits on abdominal exam.   no calf tenderness or cords.  No edema. No tenderness left shoulder, good rom with minimal pain but more than right,  pain on provocation  Left ribs not tender no masses      ASSESS:htn  hyperchol  Anxiety  Left shoulder pain                Minor internal derangement of shoulder, left shoulder         PLAN:Medications were  reviewed and no changes were made    Risks of new medication reviewed   Continue metropolol at same dose, benefits outweigh risks, rest of meds the same.  Follow up later this year  Continue to work on correct diet, optimal weight and exercise        Cell [phone]  Pt to get colonoscopy this summer, he is aware why  Shingrix age 51  Derm yearly pt is aware  Sleep apnea pt will set up in [place] a  Recheck blood test  Ribs not continue to  improve call       [facility]gent [facility]  Diazepam as a backup for anxiety, no driving or drinking, pt is aware and has not used since 2016  Pt knows leaving by Nov 1    Offered pt ortho for shoulder, he will think about    Current Outpatient Prescriptions   Medication Sig    metoprolol (TOPROL-XL) 50 MG 24 hr tablet TAKE 2 TABLETS BY MOUTH EVERY DAY    diazepam (VALIUM) 5 MG tablet Take 1 tablet (5 mg total) by mouth nightly as needed for Anxiety   Max daily dose: 5 mg     No current facility-administered medications for this visit.

## 2017-03-28 NOTE — Patient Instructions (Signed)
Blood test  Get colonoscopy asap  Shingrix and repeat 2-6 months after first starting at age 51  Dermatology yearly  Sleep apnea evaluation in [place]  Rib and shoulder not better or not improving let me know  Diet, weight loss and exercise

## 2017-04-04 ENCOUNTER — Telehealth: Payer: Self-pay | Admitting: Primary Care

## 2017-04-04 MED ORDER — AZITHROMYCIN 250 MG PO TABS *I*
ORAL_TABLET | ORAL | 0 refills | Status: AC
Start: 2017-04-04 — End: 2017-04-09

## 2017-04-04 NOTE — Telephone Encounter (Signed)
Pt is going out of town to [place] next week and is asking if there is something he can take prophylactic to protect his stomach of any possible GI issues he might get in [place]. Please advise

## 2017-04-04 NOTE — Telephone Encounter (Signed)
Reviewed precautions with pt, recommended passport health and called in script for zpack

## 2017-04-30 ENCOUNTER — Other Ambulatory Visit: Payer: Self-pay | Admitting: Primary Care

## 2017-05-31 ENCOUNTER — Ambulatory Visit: Payer: PRIVATE HEALTH INSURANCE | Admitting: Primary Care

## 2017-05-31 ENCOUNTER — Telehealth: Payer: Self-pay | Admitting: Primary Care

## 2017-05-31 NOTE — Telephone Encounter (Signed)
Pt canceled appt for today. [place] looking for a new [place]. Pt made this appt because he wanted to see you one last time. Pt did request a release to be mailed to him to transfer his records once he finds a doctor.

## 2017-07-15 ENCOUNTER — Telehealth: Payer: Self-pay | Admitting: Primary Care

## 2017-07-15 NOTE — Telephone Encounter (Signed)
Per records request, pt chose to go out of network for new pcp

## 2017-07-20 ENCOUNTER — Other Ambulatory Visit: Payer: Self-pay | Admitting: Neurology

## 2017-07-22 ENCOUNTER — Telehealth: Payer: Self-pay | Admitting: Primary Care

## 2017-07-22 NOTE — Telephone Encounter (Signed)
Per pt he saw his new doctor about a month ago. No need to refill the med

## 2017-07-22 NOTE — Telephone Encounter (Signed)
Please call pt and find out if he has established with a new MD yet in [place]. Let him know we just got a refill on a med, after oct 31 I will not be able to refill scripts

## 2017-07-22 NOTE — Telephone Encounter (Signed)
Lmtcb.

## 2018-05-16 LAB — UNMAPPED LAB RESULTS
Hematocrit (HT): 44.9 % (ref 40.0–52.0)
Hemoglobin (HGB) (HT): 15.4 g/dL (ref 13.0–17.5)
MCHC (HT): 34.3 g/dL (ref 32.0–36.0)
MCV (HT): 86.2 FL (ref 81.0–99.0)
Mean Corpuscular Hemoglobin (MCH) (HT): 29.6 pg (ref 26.0–34.0)
Platelets (HT): 312 10 3/uL (ref 140–400)
RBC (HT): 5.21 10 6/uL (ref 4.20–5.90)
RDW (HT): 12.2 % (ref 11.5–15.0)
WBC (HT): 6.7 10 3/uL (ref 4.0–10.8)

## 2018-07-08 ENCOUNTER — Other Ambulatory Visit
Admission: RE | Admit: 2018-07-08 | Discharge: 2018-07-08 | Disposition: A | Discharge status: Home / Self Care | Payer: Commercial Managed Care - PPO | Source: Ambulatory Visit | Attending: Pathology | Admitting: Pathology

## 2018-07-08 DIAGNOSIS — Z1211 Encounter for screening for malignant neoplasm of colon: Secondary | ICD-10-CM | POA: Insufficient documentation

## 2018-07-11 LAB — SURGICAL PATHOLOGY

## 2018-10-23 ENCOUNTER — Other Ambulatory Visit
Admission: RE | Admit: 2018-10-23 | Discharge: 2018-10-23 | Disposition: A | Discharge status: Home / Self Care | Payer: Commercial Managed Care - PPO | Source: Ambulatory Visit | Attending: Psychiatry | Admitting: Psychiatry

## 2018-10-23 DIAGNOSIS — F1020 Alcohol dependence, uncomplicated: Secondary | ICD-10-CM | POA: Insufficient documentation

## 2018-11-20 ENCOUNTER — Encounter: Payer: Self-pay | Admitting: Orthopedic Surgery

## 2018-11-20 ENCOUNTER — Ambulatory Visit
Admission: RE | Admit: 2018-11-20 | Discharge: 2018-11-20 | Disposition: A | Discharge status: Home / Self Care | Payer: Commercial Managed Care - PPO | Source: Ambulatory Visit | Attending: Orthopedic Surgery | Admitting: Orthopedic Surgery

## 2018-11-20 ENCOUNTER — Ambulatory Visit: Payer: Commercial Managed Care - PPO | Admitting: Orthopedic Surgery

## 2018-11-20 VITALS — BP 126/93 | HR 87 | Ht 67.0 in | Wt 220.0 lb

## 2018-11-20 DIAGNOSIS — M7541 Impingement syndrome of right shoulder: Secondary | ICD-10-CM

## 2018-11-20 DIAGNOSIS — M19011 Primary osteoarthritis, right shoulder: Secondary | ICD-10-CM | POA: Insufficient documentation

## 2018-11-20 NOTE — Progress Notes (Signed)
He is a 53 year old right-hand-dominant gentleman who presents for my consultation for right shoulder problem.  He said one year ago he did fall right onto his right shoulder.  Since that time he said some mild lingering symptoms.  He now is been getting popping and pain in the right shoulder.  He describes anterior pain.  Pain is worse with overhead activities.  No problems lying on his side.  He be does carry something with his arm supinated he gets increased pain as well.    Physical examination right shoulder: He has full range of motion of the shoulder.  He has no tenderness over the sternoclavicular joint, clavicle, acromioclavicular joint, acromion or subacromial space.  He has a positive Hawkins and Neer impingement sign.  He has pain with resistance in the Jobe arc with good strength.  Sensation is intact to light touch over the axillary, median, ulnar, radial nerve distributions.  Radial pulse is normal.    Radiographs right shoulder show some greater tuberosity sclerosis and a curved acromion morphology.    Impression: Right shoulder impingement syndrome.  Greater tuberosity sclerosis on imaging consistent with some chronic rotator cuff disease.    Plan: We'll start him on a rehabilitation program with impingement type protocol and I will check him back in about 6 weeks.    This note was transcribed using voice recognition software.  Please excuse irregularities that may result.

## 2018-12-08 ENCOUNTER — Ambulatory Visit
Payer: Commercial Managed Care - PPO | Attending: Orthopedic Surgery | Admitting: Rehabilitative and Restorative Service Providers"

## 2018-12-08 DIAGNOSIS — M7541 Impingement syndrome of right shoulder: Secondary | ICD-10-CM | POA: Insufficient documentation

## 2018-12-08 NOTE — Progress Notes (Addendum)
PT SPORTS REHABILITATION UE EVALUATION    History  Diagnosis: R shoulder impingement      Victor Madden is a 53 y.o. male who is present today for right, shoulder care.  Mechanism of injury/history of symptoms: Repetitive strain  Victor Madden reports R shoulder pain and popping beginning about 6 months ago and has been worsening. He states is would particularly happen at the gym when doing incline bench, where his shoulder would pop and then he would continue working out. Last week he states he was doing bicep curls and heard a loud pop and had sharp pain, which resolved over a few days. He reports increased pain with overhead activities, when reaching behind his back or lifting with forearm supination. He reports he goes to the gym  5-6 days/wk, performing arm exercises 4x/week.  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, Keyboarding  Stresses/physical demands of home: Self Care, Stage manager, Gardening/Yard Work and Psychiatric nurse): [name] training  Diagnostic tests: Per report, reviewed, X-ray   Other: NA    Symptom location: Anterior, right  Relevant symptoms: Sharp, popping   Symptom frequency: Intermittent  Night Pain: No      Symptoms worsen with: Reaching overhead, Reaching behind back  Symptoms improve with: Rest  Assistive device:  none  Patients goals for therapy: Perform all self care ADLs (bathing, hygiene, dressing, eating) independently, Reduce pain, Increase ROM / flexibility, Increase strength, create strategies to be able to continue working out at the gym     Objective:    Observation: WNL, bicep tendon & muscle belly intact and symetrically shaped, pec major intact and symmetrical muscle shape bilaterally  Palpation: WNL @ NA. Biceps tendon and muscle belly intact and symmetrical shape. Pec major intact and symmetrical shape.   Cervical Screen:   WNL  Neurologic:  NA        ROM/Strength    UE      AROM AROM PROM PROM MMT MMT    Right Left Right Left Right Left   Shoulder          Forward Flexion   160 160 4 4   Extension         Abduction      4 4   Internal Rotation at 90 ABD   45 55 5 5   External Rotation at 90 ABD   90 90 4 4                  Special Tests:      Biceps tendon TTP with resisted elbow flexion and supination   Hook test (negative)     Shoulder Neer,  negative  [name],  positive   Elbow NA   Wrist/hand NA      Functional:  Pain with functional IR for putting on belt, reaching to get wallet, and toileting activities   Pain with overhead activities     Assessment:   Findings consistent with 53 y.o. male with R shoulder impingement with ROM limitations, strength limitations. Pt presents with decreased IR ROM as well as weakness in his rotator cuff and scapulothoracic muscles causing decreased subacromial joint space and primary shoulder impingement. Educated pt about modifications to make to his gym routine to decrease irritation as well as decrease stress on the shoulder until he has appropriate strength and ROM to progress to  OH strengthening at the gym. Treatment will focus on stretching and strengthening of the rotator cuff and scapulothoracic muscles in order to stabilize the scapula and facilitate proper scapular mechanics with shoulder movements in order to return pt to the gym pain-free. Pt would benefit from skilled therapy in order to address the above impairments.     Personal factors affecting treatment/recovery:   none identified  Comorbidities affecting treatment/recovery:   Attention deficit disorder (ADD)   Anxiety   Depression  Clinical presentation:   evolving  Patient complexity:     low level as indicated by above stability of condition, personal factors, environmental factors and comorbidities in addition to patient symptom presentation and impairments found on physical exam.    Prognosis:  Good     Contraindications/Precautions/Limitation:  Per diagnosis    Short Term Goals: (1 month(s)): Increase ROM (IR to 60), Increase strength ER, abduction 5/5 and Minimal assistance with HEP/ education concepts  Long Term Goals: (3 month(s)): ROM/ flexibility WNL , Restoration of functional strength, Functional return to ADLs / activities without limitations     Treatment Plan:   Options / plan reviewed with patient/family:  Yes  Freq 4 times per month for 2 month(s)- 3 months    Treatment plan inclusive of:  Exercise: AROM, Stretching, Strengthening  Manual Techniques:  Soft tissue release/massage  Modalities:  Cold pack, Functional/Therapeutic activites per flowsheet, Manual therapy,  Soft tissue Mobilization / Massage, Moist heat, Ther Exercise per flowsheet  Functional: Proprioception/Dynamic stability, Endurance training      Thank you for the referral of this patient to [facility].  Nerine Pulse, PT  Laney Potash, SPT     Posterior capsule stretch 10x10  sec   Sidelying ER 3x15   Prone row 3x8, 3-5#   Prone I  10x10 sec                              Minutes   Time Based CPT  Physical Performance test, Therapeutic Exercise, Therapeutic Activities, NM Re-Education, Manual Therapy, Gait Training, Massage, Aquatic Therapy, Canalith Repositioning, Iontophoresis, Ultrasound, Orthotic fitting/training, Prosthetic fitting 15   Service Based (untimed) CPT  PT/OT evaluation, PT/OT re-eval, E-stim-unattended, Mechanical traction, Vasopneumatic device 30   Unbilled time (rest, etc)        Total Treatment Time 45

## 2018-12-25 ENCOUNTER — Ambulatory Visit: Payer: Commercial Managed Care - PPO | Admitting: Rehabilitative and Restorative Service Providers"

## 2019-01-01 ENCOUNTER — Ambulatory Visit: Payer: Commercial Managed Care - PPO | Admitting: Orthopedic Surgery

## 2019-01-06 ENCOUNTER — Ambulatory Visit: Payer: Commercial Managed Care - PPO | Admitting: Rehabilitative and Restorative Service Providers"

## 2019-01-20 ENCOUNTER — Ambulatory Visit: Payer: Commercial Managed Care - PPO | Admitting: Rehabilitative and Restorative Service Providers"

## 2019-02-05 ENCOUNTER — Ambulatory Visit: Payer: Commercial Managed Care - PPO | Admitting: Rehabilitative and Restorative Service Providers"

## 2019-03-16 ENCOUNTER — Ambulatory Visit
Admission: AD | Admit: 2019-03-16 | Discharge: 2019-03-16 | Disposition: A | Discharge status: Home / Self Care | Payer: Commercial Managed Care - PPO | Source: Ambulatory Visit | Attending: Orthopedic Surgery | Admitting: Orthopedic Surgery

## 2019-03-16 ENCOUNTER — Ambulatory Visit
Admit: 2019-03-16 | Discharge: 2019-03-16 | Disposition: A | Discharge status: Home / Self Care | Payer: Commercial Managed Care - PPO | Admitting: Radiology

## 2019-03-16 DIAGNOSIS — S43401A Unspecified sprain of right shoulder joint, initial encounter: Secondary | ICD-10-CM | POA: Insufficient documentation

## 2019-03-16 DIAGNOSIS — S4991XA Unspecified injury of right shoulder and upper arm, initial encounter: Secondary | ICD-10-CM

## 2019-03-16 MED ORDER — KETOROLAC TROMETHAMINE 30 MG/ML IJ SOLN *I*
30.0000 mg | Freq: Once | INTRAMUSCULAR | Status: AC
Start: 2019-03-16 — End: 2019-03-16
  Administered 2019-03-16: 19:00:00 30 mg via INTRAMUSCULAR

## 2019-03-16 NOTE — ED Notes (Signed)
Pt discharged home. Pt verbalized understanding of all discharge instructions including follow-up. All questions answered. Pt belonging accounted for and taken by patient at discharge.

## 2019-03-16 NOTE — Discharge Instructions (Addendum)
You were seen today for right shoulder sprain.  Your x-rays do not show any fracture or dislocation.  Please follow-up with Dr. Lovie Macadamia in regards to further evaluation.  You were given a sling for comfort.  Please come out of it several time s a day and perform gentle range of motion exercises.  I did give you Toradol while you're here.  You cannot take ibuprofen until tomorrow morning.  Follow-up with primary care doctor as needed.

## 2019-03-16 NOTE — ED Triage Notes (Signed)
Sat on yoga ball and reached back, rolled off ball and ended up on right shoulder.  Now unable to lift right arm up.  Happened within the hour. Previous injury to right shoulder with impingement saw ortho in March 2020 for this.       Triage Note   Cherylin Mylar, RN

## 2019-03-18 ENCOUNTER — Encounter: Payer: Self-pay | Admitting: Orthopedic Surgery

## 2019-03-18 ENCOUNTER — Ambulatory Visit: Payer: Commercial Managed Care - PPO | Admitting: Orthopedic Surgery

## 2019-03-18 VITALS — BP 142/96 | HR 79 | Ht 66.5 in | Wt 215.0 lb

## 2019-03-18 DIAGNOSIS — S4980XA Other specified injuries of shoulder and upper arm, unspecified arm, initial encounter: Secondary | ICD-10-CM

## 2019-03-18 NOTE — Progress Notes (Signed)
He presents for a reinjury to his right shoulder.  When I saw him in February is being sent for formal rehabilitation.  He is been doing that.  2 days ago he was doing his home exercise program and reached behind him and fell, reinjuring his right shoulder.  Now he has difficulty raising his arm and increased pain.    Physical examination right shoulder: He is noted to have prominence of the biceps and his arm at tenderness over the bicipital groove.  He has no elbow tenderness.  He has pain with resisted supination.  His active forward flexion against gravity is to 90.  He has 30 of external rotation with his elbow at his side.  He has weakness with external rotation and considerable weakness in the Jobe arc with pain.  Sensation is intact to light touch over the axillary, median, ulnar, and radial nerve distributions.  Radial pulse is normal.    Review of radiographs that were performed at the urgent care center on June 15 consist of an AP, Grashey, and scapular Y radiographs only and they are within normal limits.    Impression: Right proximal biceps tendon tear.  Possible rotator cuff injury as well.    Plan: I am going to send him for MRI examination of the right shoulder for further evaluation will see him back shortly following that study.  Questions are answered and a large portion of today's visit was spent education and counseling the patient.    This note was transcribed using voice recognition software.  Please excuse irregularities that may result.

## 2019-03-19 NOTE — UC Provider Note (Signed)
History     Chief Complaint   Patient presents with    Arm Pain     right shoulder and arm pain, feels like pulled muscles in chest and back as well     HPI : 53 year old male presents today with complaints of right shoulder pain.  He states 2 days ago he was sitting on a local bowel to do some stretching and he reached back and accidentally rolled off the ball and flipping over backwards and landing on his right shoulder.  He states now he has difficulty lifting the right arm.  He denies any prior injury or trauma to the shoulder.  He states she did have a previous injury to the shoulder and was diagnosed with impingement by orthopedics in March.  He has been taking ibuprofen.  He has not been applying any ice or heat.    Medical/Surgical/Family History     Past Medical History:   Diagnosis Date    Anxiety     Depression     Hypertension     Substance abuse         Patient Active Problem List   Diagnosis Code    Anxiety F41.1    Hypertension I10    Hyperlipidemia E78.5    Depression F32.9    Attention Deficit Disorder Without Hyperactivity F90.9    Warts B07.9            Past Surgical History:   Procedure Laterality Date    KNEE SURGERY      Knee Surgery Conversion Data      Family History   Problem Relation Age of Onset    Conversion Other         [id number]^GUYQIHKV Cancer^V16.42^Active^    Conversion Other         [id number]^Hypertension^401.9^Active^    High Blood Pressure Mother           Social History     Tobacco Use    Smoking status: Never Smoker    Smokeless tobacco: Never Used   Substance Use Topics    Alcohol use: Not on file    Drug use: Not on file     Living Situation     Questions Responses    Patient lives with     Homeless     Caregiver for other family member     External Services     Employment     Domestic Violence Risk                 Review of Systems   Review of Systems   Constitutional: Negative.    HENT: Negative.    Eyes: Negative.    Respiratory: Negative.     Cardiovascular: Negative.    Gastrointestinal: Negative.    Genitourinary: Negative.    Musculoskeletal:        Right shoulder pain   Skin: Negative.    Neurological: Negative.        Physical Exam   Triage Vitals  Triage Start: Start, (03/16/19 1750)   First Recorded BP: (!) 179/107, Resp: 18, Temp: 36.7 C (98.1 F), Temp src: TEMPORAL Oxygen Therapy SpO2: 97 %, Oximetry Source: Rt Hand, O2 Device: None (Room air), Heart Rate: 76, (03/16/19 1747)  .      Physical Exam  Vitals signs and nursing note reviewed.   Constitutional:       Appearance: Normal appearance.   HENT:      Head: Normocephalic and atraumatic.   Cardiovascular:  Rate and Rhythm: Normal rate and regular rhythm.      Pulses: Normal pulses.      Heart sounds: Normal heart sounds.   Pulmonary:      Effort: Pulmonary effort is normal.      Breath sounds: Normal breath sounds.   Musculoskeletal:      Comments: Right shoulder: No obvious deformity.  He has forward flexion and abduction only to approximately 40 actively.  I was able to increase this to 90 passively.  He has limited internal and external rotation.  He centered over the lateral, superior and posterior aspect of the shoulder.  He has no palpable deformity.  He has equal bilateral grip strength.  He does have tenderness along the right paracervical musculature and down the right trapezius.  He is distally neurovascularly intact.   Skin:     General: Skin is warm and dry.   Neurological:      Mental Status: He is alert and oriented to person, place, and time.          Medical Decision Making        Initial Evaluation:  ED First Provider Contact     Date/Time Event User Comments    03/16/19 1807 ED First Provider Contact Clelia CroftSCOTT, Adekunle Rohrbach A Initial Face to Face Provider Contact          Patient was seen on: 03/16/2019        Assessment:  52 y.o.male [facility] with   right shoulder strain with possible rotator cuff injury.  Patient does have his own orthopedic physician.   He will follow-up tomorrow.      Differential Diagnosis includes: Shoulder sprain, strain, rotator cuff injury, bursitis, muscle spasm      Plan: You were seen today for right shoulder sprain.  Your x-rays do not show any fracture or dislocation.  Please follow-up with Dr. Terance HartBronstein in regards to further evaluation.  You were given a sling for comfort.  Please come out of it several time s a day and perform gentle range of motion exercises.  I did give you Toradol while you're here.  You cannot take ibuprofen until tomorrow morning.  Follow-up with primary care doctor as needed.           Final Diagnosis  Final diagnoses:   [S43.401A] Sprain of right shoulder, unspecified shoulder sprain type, initial encounter (Primary)         Clelia CroftKERRY Brinsley Wence, PA              Clelia CroftScott, Juliyah Mergen, [place]  03/19/19 [phone]

## 2019-04-10 ENCOUNTER — Ambulatory Visit
Admission: RE | Admit: 2019-04-10 | Discharge: 2019-04-10 | Disposition: A | Discharge status: Home / Self Care | Payer: Commercial Managed Care - PPO | Source: Ambulatory Visit | Attending: Orthopedic Surgery | Admitting: Orthopedic Surgery

## 2019-04-10 DIAGNOSIS — S43431A Superior glenoid labrum lesion of right shoulder, initial encounter: Secondary | ICD-10-CM | POA: Insufficient documentation

## 2019-04-10 DIAGNOSIS — M25411 Effusion, right shoulder: Secondary | ICD-10-CM | POA: Insufficient documentation

## 2019-04-10 DIAGNOSIS — S4980XA Other specified injuries of shoulder and upper arm, unspecified arm, initial encounter: Secondary | ICD-10-CM

## 2019-04-10 DIAGNOSIS — S46111A Strain of muscle, fascia and tendon of long head of biceps, right arm, initial encounter: Secondary | ICD-10-CM | POA: Insufficient documentation

## 2019-04-10 DIAGNOSIS — S46011A Strain of muscle(s) and tendon(s) of the rotator cuff of right shoulder, initial encounter: Secondary | ICD-10-CM | POA: Insufficient documentation

## 2019-04-15 ENCOUNTER — Ambulatory Visit: Payer: Commercial Managed Care - PPO | Admitting: Orthopedic Surgery

## 2019-04-15 ENCOUNTER — Encounter: Payer: Self-pay | Admitting: Orthopedic Surgery

## 2019-04-15 VITALS — BP 158/93 | HR 89 | Ht 67.0 in | Wt 213.0 lb

## 2019-04-15 DIAGNOSIS — M75121 Complete rotator cuff tear or rupture of right shoulder, not specified as traumatic: Secondary | ICD-10-CM

## 2019-04-15 NOTE — Progress Notes (Signed)
He returns following the MRI examination of his right shoulder.  This is reviewed with him.  It does show a massive retracted rotator cuff tear as well as a biceps tendon tear.  There is fatty infiltrate of the supraspinatus.    Impression: Right shoulder massive retracted rotator cuff tear with proximal biceps rupture.    Plan: I long discussion with him regarding this.  I am going to send him to my partner Dr.Ilya Voloshin regarding options for this.    This note was transcribed using voice recognition software.  Please excuse irregularities that may result.

## 2019-05-08 ENCOUNTER — Ambulatory Visit: Payer: Commercial Managed Care - PPO | Admitting: Sports Medicine

## 2019-05-08 ENCOUNTER — Encounter: Payer: Self-pay | Admitting: Sports Medicine

## 2019-05-08 VITALS — BP 136/98 | HR 89 | Ht 67.0 in | Wt 215.0 lb

## 2019-05-08 DIAGNOSIS — M7541 Impingement syndrome of right shoulder: Secondary | ICD-10-CM

## 2019-05-08 DIAGNOSIS — S46011A Strain of muscle(s) and tendon(s) of the rotator cuff of right shoulder, initial encounter: Secondary | ICD-10-CM

## 2019-05-08 NOTE — Progress Notes (Signed)
Diagnosis and Code:     ICD-10-CM ICD-9-CM   1. Traumatic complete tear of right rotator cuff, initial encounter  S46.011A 840.4   2. Subacromial impingement, right  M75.41 726.19           Shoulder     _0  Right      _1  Left        _2  Rotator Cuff Repair Arthroscopy, 65465    _3  possible       _4  Superior Capsular Reconstruction Arthroscopy, 29827  _5  possible          _6  Arthroscopic Debridement,  03546         _7  Subacromial Decompression/Acromioplasty Arthroscopy, [phone]         _8  Distal Clavicle Excision Arthroscopy, 29824         _9  Arthroscopic Bankart Repair, 75170   _10  Open Capsular Repair, 01749          _11  Labral/SLAP Repair Arthroscopy, [phone]    _12  possible         _13  Synovectomy Arthroscopy, (full: 29821 partial:29822),               _14  Bicep Tenodesis/Tenotomy, 23430    _15  possible           _16  Total Shoulder Athroplasty, 23472   _17  Lesser Tuberosity Osteotomy, 24400   _18  Reverse Total Shoulder Arthroplasty, 23472   _19  Latarjet, Open, [id number]   _20  Additional Codes:    _21  29827  _22  59163 _23  84665 _24  99357 _25  01779    _26  39030  _27  09233      _28  Other:         Elbow    _29  Right      _30  Left        _31  Arthroscopic Debridement, 00762        _32  Arthroscopic Extensive Debridement, 26333        _33  Loose Body Removal Arthroscopy, 54562          _34  Ulnar nerve transpose/decompression, 64718        _35  Distal bicep repair, 56389        _36  Elbow arthroplasty, 37342        _37  Open capsular release, 24006        _38  Debridement of the Common Extensor, Open, 87681         _39  Other:      Anesthesia: _40  Choice _41  General _42  MAC _43  Brachial Plexus Block      Position: _44  Beach chair/Spider  _45  Lateral Decubitus _46  Supine      Admit Type:  _47  ASC  _48  Inpatient/SDA _49  23hr      Length of Procedure:  _50  30 min _51  1 hr  _52  1.5 hr  _53 2 hr    _54  Other:    Labs:    _55  CBC, SMA 12, PT/PTT, ESR, CRP, UA w/C+S, Type & Screen   _56  EKG   _57  Chest XR      Medical Clearance:   _58  PCP  _59  Cardiology _60  Rheum _61  Other:   _62   Anesthesia consult      Special Equipment:   _63  C-arm _64  Skytron table/Nicolson's headrest  _65  CPM   _66  Hand table   _67  Other:   _68  Arthrex:   _69  Zimmer:    _70  NICKEL ALLERGY       Insurance:  _71  WC  _72  MVA      Biologics:        _73   Angel PRP        _0  Stem Cell ([place])      Therapy Preferences:  _1  Sports PT   _2  Hand Therapy _3  [facility]   _4  [place]   _5  [place]   _6  [place]   _7  [place]    _8  [place]   _9  [place]   _10  [place]          _11  [facility]  _12  [place]    _13  Flow90 Study      COMMENTS:          Cayey Caul, MD  Professor of Orthopaedics  Director: Shoulder and Elbow Division  Head Physician: [facility]  Department of Orthopaedics  [address], [place], [place] [address]  Office tel: [phone]  Office fax: [phone]  Appointments tel: [phone]

## 2019-05-08 NOTE — Progress Notes (Signed)
PATIENTMarland Kitchen   Madden, Victor MR #:  [id number]   CSN:  [id number] DOB:  1966/03/24   DICTATED BY:  [place] Caul, MD DATE OF VISIT:  05/08/2019     ADDENDUM:  The patient is being seen as a consult request from Dr. Lovie Macadamia for evaluation of right shoulder pain.  The patient is a 53 year old male who fell off a yoga ball and had injury to his right shoulder.  He has pain over the anterior and lateral aspect of the shoulder.  Significant weakness in the shoulder.  No numbness and tingling.    ASSESSMENT AND PLAN:  The patient has right shoulder massive rotator cuff tear involving subscapularis, supraspinatus, and infraspinatus and subacromial impingement syndrome.  Treatment options were discussed.  The patient was proposed to have right shoulder arthroscopic rotator cuff repair, subacromial decompression, debridement in glenohumeral joint depending on the findings, possible superior capsular reconstruction with dermal allograft patch if the rotator cuff is found to be irreparable, possible biceps tenodesis if biceps damage is found in the glenohumeral joint.  The risks and benefits of surgery and alternatives of treatment were discussed.  The patient understood all the risks and benefits, and he wants to proceed with surgical option.  The patient will call my office to schedule the procedure at his convenience.     Thanks to Dr. Lovie Macadamia and Dr. Satira Anis for allowing me to participate in Victor Madden's care.             ______________________________  [place] Caul, MD    IV/MODL  DD:  05/08/2019 08:16:03  DT:  05/08/2019 13:29:13  Job #:  [id number]    cc:  Edwyna Ready, MD   [address] [place], [place] [address]

## 2019-05-08 NOTE — Progress Notes (Signed)
History:  Victor Madden is a 53 y.o. that is being seen as a consult request from Dr. Lovie Macadamia for evaluation of his Right shoulder pain.  Patient was seen in March by Dr. Lovie Macadamia for subacromial impingement type symptoms.  Patient went to therapy a few times but then because of [place] pandemic started doing his therapy at home.  He was doing well, pain was decreasing.  Then about a month ago patient fell off a yoga ball when doing chest flys.  He had immediate pain.  He then was sent for MRI and this revealed massive rotator cuff tear.  Patient continues to have weakness and pain.  Patient has pain at night.  He has been able to maintain range of motion, as he states he has still been doing exercises a at home.  He denies numbness and tingling.  The patient is right UE dominant.     Past medical history, past surgical history, medications, allergies, family history, social history, and review of systems were reviewed today and have been documented separately in this encounter.      Physical Examination:  He is in no acute distress.  He is alert and oriented x 3.   Examination of his neck demonstrates normal cervical range of motion with nomidline and no paraspinal tenderness.  There is no evidence of jugular venous distension.  Skin appearance is normal with no evidence of rash or other lesions.  All distal pulses are palpable. Neurovascular exam is normal and unremarkable.  Range of Motion: AROM  The right shoulder demonstrates active forward elevation to 150 degrees. He externally rotates to 50 degrees. He internally rotates to L5.   Strength:  Forward Elevation MMT 4/5  External rotation MMT +3/5  Internal rotation MMT 4/5    Special tests:  Hawkins test was positive.    Neer test was positive.    O'Brian test was negative.     ER lag sign test was negative  Belly Press sign was negative  Jobe's test was negative with pain.  Palpation:  There is not tenderness at the [facility] joint. There is not tenderness  along the biceps tendon. Distally he is neurovascularly intact. There is not scapular dyskinesia.  There is not scapular winging.  Imaging: I personally reviewed the patients images. MRI demonstrate supraspinatus complete tear retracted to the glenohumeral joint with a large full-thickness infraspinatus retracted tear.  Torn biceps and partially torn subscapularis tendon. SLAP tear.  Joint effusion as well as fluid in the subacromial subdeltoid bursa.        Assessment:  Right shoulder massive rotator cuff tear, proximal biceps rupture.  Plan: I discussed the diagnosis and the treatment options with the patient.  The patient has massive rotator cuff tear. he continues to be significantly symptomatic and has failed all other conservative measures.  At this time it was proposed he have right shoulder rotator cuff repair, possible superior capsule reconstruction, subacromial decompression, debridement, possible biceps tenodesis. The risks and benefits of the surgery and alternatives of treatment were discussed. The patient understands all the risks and benefits for this procedure and was given instructions for scheduling through my office.       We will see him back in postoperatively to evaluate.    Thomasenia Sales, ATC performed the duties of a scribe for this encounter in the presence of the documenting physician. This patient was seen by Dr. Carlisle Beers who personally examined the patient and determined the plan of care.

## 2019-05-08 NOTE — Invasive Procedure Plan of Care (Signed)
[facility]  [facility]  [facility]    CONSENT FOR MEDICAL  OR SURGICAL PROCEDURE                            Patient Name: Victor KendallScot William Madden  [facility] [id number]                                                              DOB: Feb 08, 1966         Please read this form or have someone read it to you.   It's important to understand all parts of this form. If something isn't clear, ask us to explain.   When you sign it, that means you understand the form and give us permission to do this surgery or procedure.     I agree for Jeanett SchleinVoloshin, Afshin Chrystal, MD along with any assistants* they may choose, to treat the following condition(s): RIGHT SHOULDER MASSIVE ROTATOR CUFF TEAR   By doing this surgery or procedure on me: RIGHT SHOULDER ARTHROSCOPIC ROTATOR CUFF REPAIR,  ARTHROSCOPIC DEBRIDEMENT, SUBACROMIAL DECOMPRESSION/ACROMIOPLASTY,  POSSIBLE SUPERIOR CAPSULAR RECONSTRUCTION WITH DERMAL TISSUE ALLOGRAFT OR BICEPS TENDON AUTOGRAFT, WITH POSSIBLE OPEN BICEPS TENODESIS, OTHER  PROCEDURES IF INDICATED.   This is also known as: RIGHT SHOULDER ARTHROSCOPIC ROTATOR CUFF REPAIR,  ARTHROSCOPIC DEBRIDEMENT, SUBACROMIAL DECOMPRESSION/ACROMIOPLASTY,  POSSIBLE SUPERIOR CAPSULAR RECONSTRUCTION WITH DERMAL TISSUE ALLOGRAFT OR BICEPS TENDON AUTOGRAFT, WITH POSSIBLE OPEN BICEPS TENODESIS, OTHER  PROCEDURES IF INDICATED.   Laterality: Right     *if you'd like a list of the assistants, please ask. We can give that to you.    1. The care provider has explained my condition to me. They have told me how the procedure can help me. They have told me about other ways of treating my condition. I understand the care provider cannot guarantee the result of the procedure. If I don't have this procedure, my other choices are: NON-OPERATIVE TREATMENT (THERAPY, INJECTION, MEDICATION)    2. The care provider has told me the risks (problems that can happen) of the procedure. I understand there may be unwanted results. The risks that  are related to this procedure include: INFECTION, BLEEDING, FAILURE TO HEAL, PERSISTENT PAIN, NEED FOR FURTHER SURGERY, DAMAGE TO NERVES AND VESSELS, BLOOD CLOTS, PE, STROKE, HEART ATTACK, DEATH. EXPOSURE TO COVID-19    3. I understand that during the procedure, my care provider may find a condition that we didn't know about before the treatment started. Therefore, I agree that my care provider can perform any other treatment which they think is necessary and available.    4. I understand the care provider may remove tissue, body parts, or materials during this procedure. These materials may be used to help with my diagnosis and treatment. They might also be used for teaching purposes or for research studies that I have separately agreed to participate in. Otherwise they will be disposed of as required by law.    5. My care provider might want a representative from a medical device company to be there during my procedure. I understand that person works for:          The ways they might help my care provider during my procedure include:  Helping the operating room staff prepare the special device my care provider wants to use. and Providing information and support to operating room staff regarding the device.    6. Here are my decisions about receiving blood, blood products, or tissues. I understand my decisions cover the time before, during and after my procedure, my treatment, and my time in the hospital. After my procedure, if my condition changes a lot, my care provider will talk with me again about receiving blood or blood products. At that time, my care provider might need me to review and sign another consent form, about getting or refusing blood.    I understand that the blood is from the community blood supply. Volunteers donated the blood, the volunteers were screened for health problems. The blood was examined with very sensitive and accurate tests to look for hepatitis, HIV/AIDS, and other diseases.  Before I receive blood, it is tested again to make sure it is the correct type.    My chances of getting a sickness from blood products are small. But no transfusion is 100% safe. I understand that my care provider feels the good I will receive from the blood is greater than the chances of something going wrong. My care provider has answered my questions about blood products.      My decision  about blood or  blood products   Yes, I agree to recieve blood or blood products if my care provider thinks they're needed.        My decision   about tissue  Implants     Yes          I understand this  form.    My care provider  or his/her  assistants have  explained:   What I am having done and why I need it.  What other choices I can make instead of having this done.  The benefits and possible risks (problems) to me of having this done.  The benefits and possible risks (problems) to me of receiving transplants, blood, or blood products.  There is no guarantee of the results.  The care provider may not stay with me the entire time that I am in the operating or procedure room.  My provider has explained how this may affect my procedure. My provider has answered my  questions about this.         I give my  permission for  this surgery or  procedure.            _______________________________________________                                     My signature  (or parent or other person authorized to sign for you, if you are unable to sign for  yourself or if you are under 52 years old)        ______           Date        _____        Time   Electronic Signatures will display at the bottom of the consent form.    Care provider's statement: I have discussed the planned procedure, including the possibility for transfusion of blood  products or receipt of tissue as necessary; expected benefits; the possible complications and risks; and possible alternatives  and their benefits and risks with the patients or the patient's surrogate.  In my opinion, the patient or the patient's surrogate  understands the proposed procedure, its risks, benefits and alternatives.              Electronically signed by: Goddard Caul, MD                                                05/08/2019         Date        9:29 AM        Time

## 2019-05-20 ENCOUNTER — Ambulatory Visit: Payer: Commercial Managed Care - PPO | Admitting: Sports Medicine

## 2019-05-20 ENCOUNTER — Encounter: Payer: Self-pay | Admitting: Sports Medicine

## 2019-05-20 VITALS — BP 151/96 | HR 82 | Ht 67.0 in | Wt 215.0 lb

## 2019-05-20 DIAGNOSIS — S46011D Strain of muscle(s) and tendon(s) of the rotator cuff of right shoulder, subsequent encounter: Secondary | ICD-10-CM

## 2019-05-20 NOTE — Progress Notes (Signed)
PATIENTMarland Kitchen   AYIDEN, MILLIMAN MR #:  [id number]   CSN:  [id number] DOB:  March 10, 1966   DICTATED BY:  [place] Caul, MD DATE OF VISIT:  05/20/2019     The patient is following up regarding his right shoulder.  He was previously diagnosed with right shoulder massive rotator cuff tear involving subscapularis, supraspinatus, and infraspinatus as well as subacromial impingement syndrome.  The patient was proposed to have surgery, including rotator cuff repair and possible SCR with dermal allograft patch as well as possible biceps tenodesis and subacromial decompression and debridement.  The patient had some further questions and those were discussed in detail today. physical     On physical examination, right upper extremity demonstrates nonpainful motion of the right elbow and right wrist.  Distally, he is intact in median, ulnar, and radial nerve distributions.  Pulses are intact.  Right shoulder demonstrates active elevation to 150 degrees, external rotation to 60 degrees, internal rotation to L3.  Strength is 3/5 for shoulder flexion and external rotation.    ASSESSMENT AND PLAN:  The patient has right shoulder massive rotator cuff tear and subacromial impingement syndrome.  Surgery was discussed in detail.  Again, the patient wants to proceed with surgery.  He is thinking to do this some time in December.  The patient will call to schedule at his convenience.  He understands the downside of waiting.     Thanks to Dr. Satira Anis for allowing me to participate in Skylor's care.             ______________________________  [place] Caul, MD    IV/MODL  DD:  05/20/2019 10:12:40  DT:  05/20/2019 13:42:20  Job #:  [id number]    cc:

## 2019-05-26 ENCOUNTER — Encounter: Payer: Self-pay | Admitting: Sports Medicine

## 2019-05-26 NOTE — Progress Notes (Signed)
Victor Madden comes to the office today for follow up of the right shoulder.  They were last seen on 05/20/2019 and diagnosed with full thickness rotator cuff tear.  Previous MRI showed massive rotator cuff tear.  They have tried NSAID/APAP and physical therapy without lasting relief.  HE is planning on surgical intervention, but cannot proceed until December.  He would like to try a cortisone injection for relief of discomfort until then.   Pain is over the anterior and lateral aspect of the shoulder.  It is worse with activity and somewhat decreased with rest.  Trouble sleeping.  No distal paresthesias.  No new injury since last visit.    A&O x 3  AVSS  Right shoulder with intact skin. No erythema, edema, echymosis.  ROM FF 150, ER 50  Strength 4/5 with FF and ER  Positive impingement signs  CMS intact distally.  Good motion of elbow wrist and digits.    A/P:    RIght shoulder massive rotator cuff tear.    Injection today.  Risks and benefits discussed.  Please see separate procedure note for details.  Post injection activity and instructions discussed.  He will follow up as needed.

## 2019-05-27 ENCOUNTER — Ambulatory Visit: Payer: Commercial Managed Care - PPO | Admitting: Orthopedic Surgery

## 2019-05-27 ENCOUNTER — Encounter: Payer: Self-pay | Admitting: Orthopedic Surgery

## 2019-05-27 VITALS — BP 143/102 | HR 73 | Ht 67.0 in | Wt 215.0 lb

## 2019-05-27 DIAGNOSIS — S46011D Strain of muscle(s) and tendon(s) of the rotator cuff of right shoulder, subsequent encounter: Secondary | ICD-10-CM

## 2019-05-27 MED ORDER — LIDOCAINE HCL 1 % IJ SOLN *I*
5.0000 mL | Freq: Once | INTRAMUSCULAR | Status: AC | PRN
Start: 2019-05-27 — End: 2019-05-27
  Administered 2019-05-27: 5 mL via INTRA_ARTICULAR

## 2019-05-27 MED ORDER — BETAMETHASONE ACET & SOD PHOS 6 (3-3) MG/ML IJ SUSP *I*
12.0000 mg | Freq: Once | INTRAMUSCULAR | Status: AC | PRN
Start: 2019-05-27 — End: 2019-05-27
  Administered 2019-05-27: 10:00:00 12 mg via INTRA_ARTICULAR

## 2019-05-27 NOTE — Procedures (Signed)
Large Joint Aspiration/Injection Procedure: R subacromial bursa    Date/Time: 05/27/2019  9:10 AM EDT  Consent given by: patient  Site marked: site marked  Timeout: Immediately prior to procedure a time out was called to verify the correct patient, procedure, equipment, support staff and site/side marked as required     Procedure Details    Location: shoulder - R subacromial bursa  Preparation: The site was prepped using the usual aseptic technique.  Anesthetics administered: 5 mL lidocaine hcl 1 %  Intra-Articular Steroids administered: 12 mg betamethasone acetate & sodium phosphate 6 (3-3) MG/ML  Dressing:  A dry, sterile dressing was applied.  Patient tolerance: patient tolerated the procedure well with no immediate complications

## 2019-05-28 ENCOUNTER — Ambulatory Visit: Payer: Commercial Managed Care - PPO | Admitting: Orthopedic Surgery

## 2022-02-14 IMAGING — MR MRI LEFT SHOULDER WITHOUT CONTRAST
5 of 7 series · 22 of 40 positions shown · IV contrast (gadolinium)
Comparison: MRI examination of the left shoulder performed the same day.

________________________________________________________________________________________________ 
MRI LEFT SHOULDER WITHOUT CONTRAST, 02/14/2022 [DATE]: 
CLINICAL INDICATION: Bilateral shoulder pain and limited range of motion, mainly 
on the right. Old weightlifting injury. Chronic bilateral shoulder discomfort, 
features consistent with chronic mild rotator cuff pathology.
TECHNIQUE: Multiplanar, multiecho position MR images of the left shoulder were 
performed without intravenous gadolinium enhancement. Patient was scanned on a 
1.5T magnet.

[Series 101: survey_fullfov_transversal · axial · left · 10.0mm · 1.84mm/px · 1 of 7 slices shown]
[im 1/7]
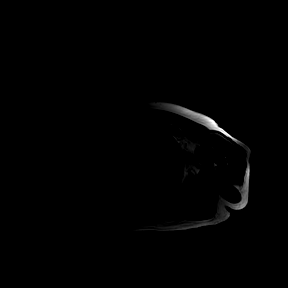

[Series 201: survey left · axial · left · 10.0mm · 0.71mm/px · z∈[-40,+125]mm · 4 of 15 slices shown]
[im 1/15]
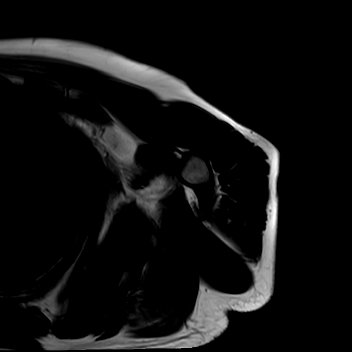
[im 5/15]
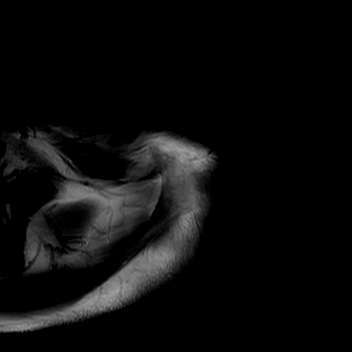
[im 10/15]
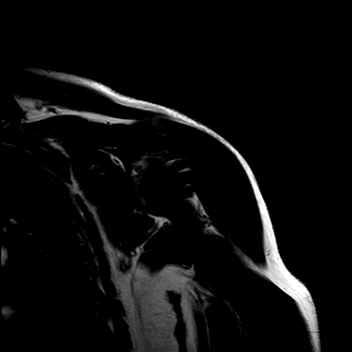
[im 15/15]
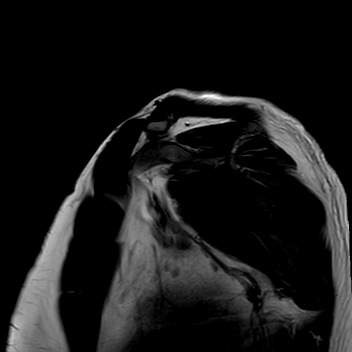

[Series 301: (person_name)_(person_name)_(person_name) · axial · left · 3.0mm · 0.35mm/px · z∈[-43,+46]mm · 7 of 28 slices shown]
[im 1/28]
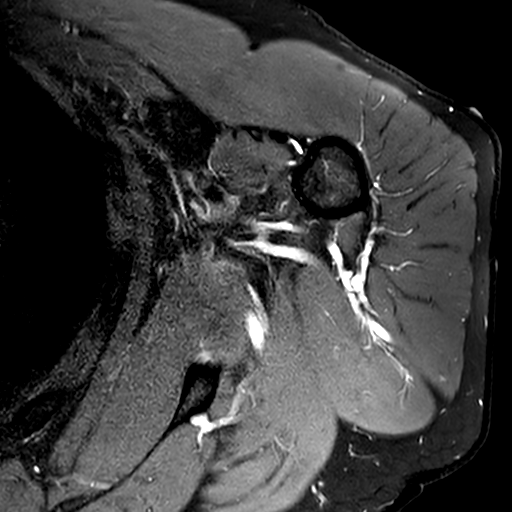
[im 5/28]
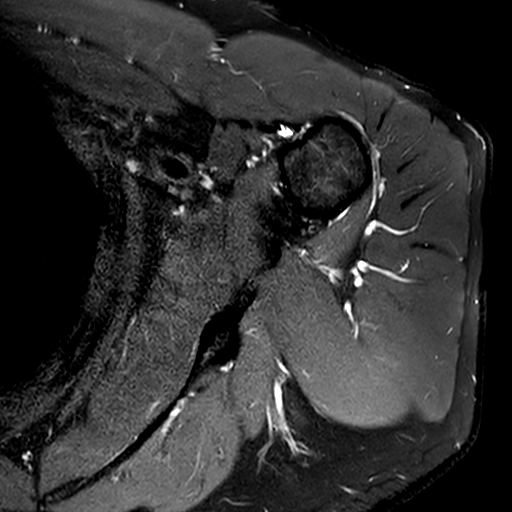
[im 10/28]
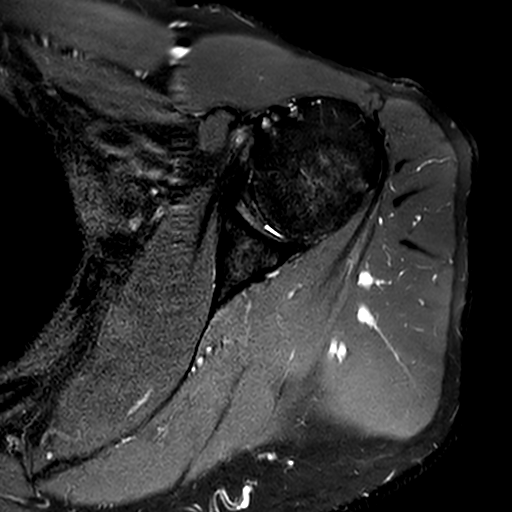
[im 14/28]
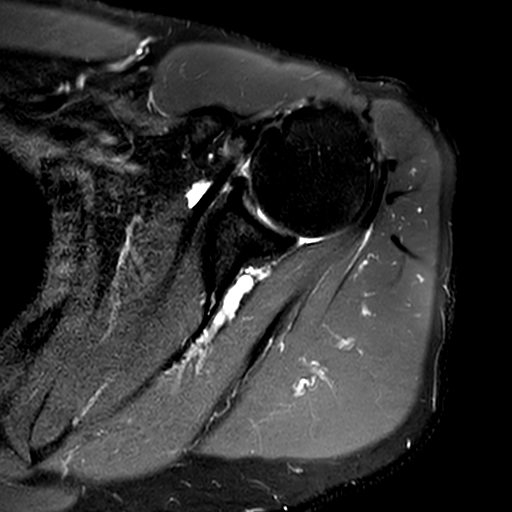
[im 19/28]
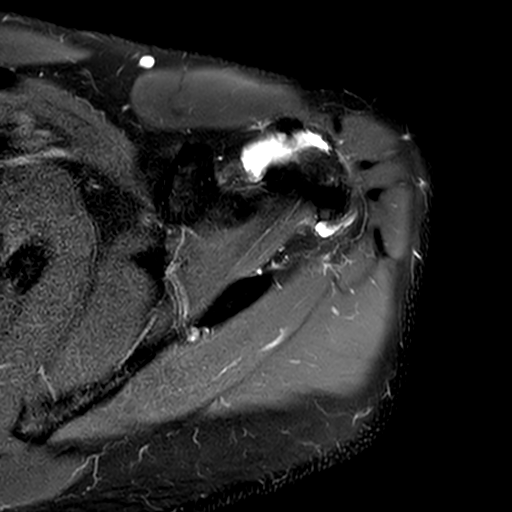
[im 23/28]
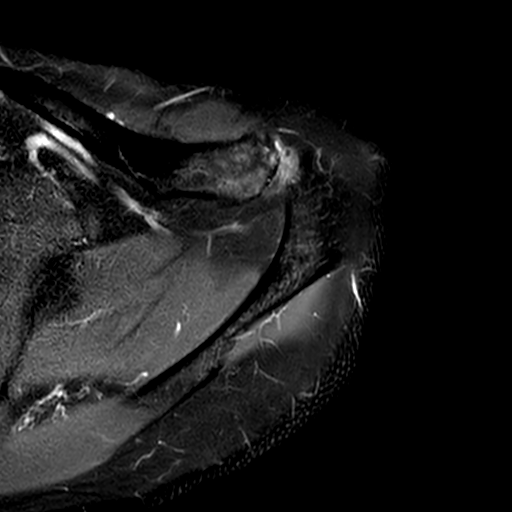
[im 28/28]
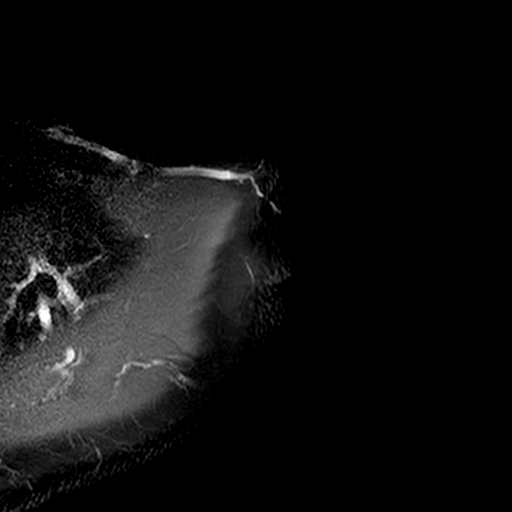

[Series 401: t2_fs_sag left · coronal · left · 3.0mm · 0.35mm/px · 8 of 30 slices shown]
[im 1/30]
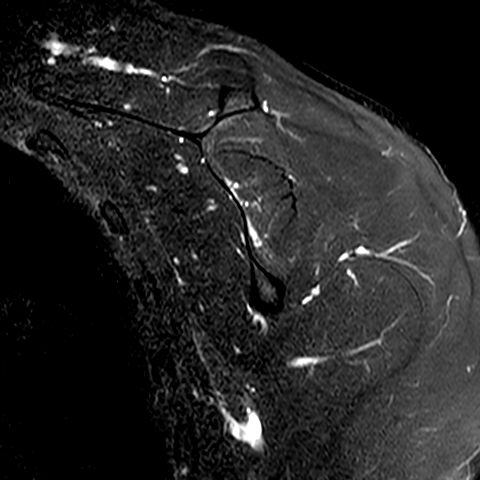
[im 5/30]
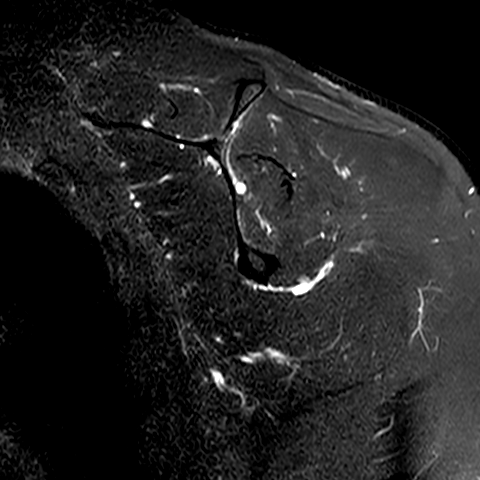
[im 9/30]
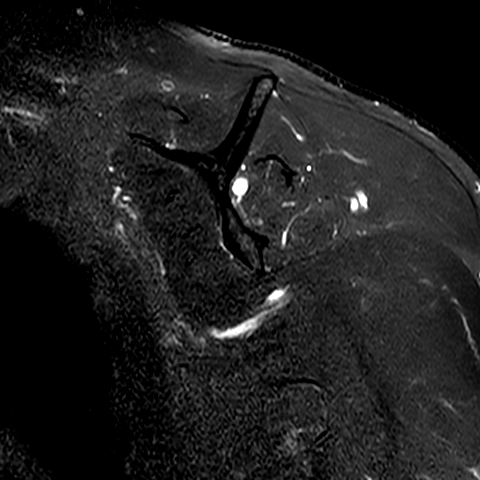
[im 13/30]
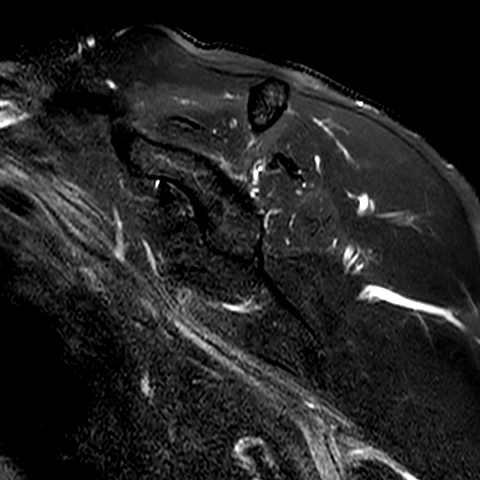
[im 17/30]
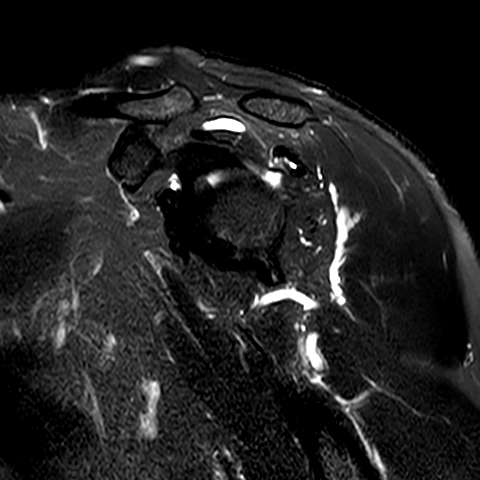
[im 21/30]
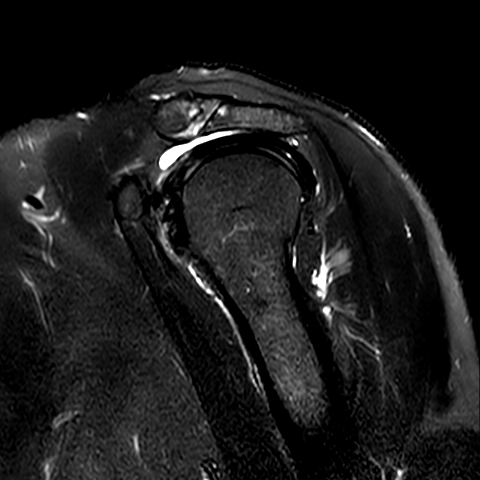
[im 25/30]
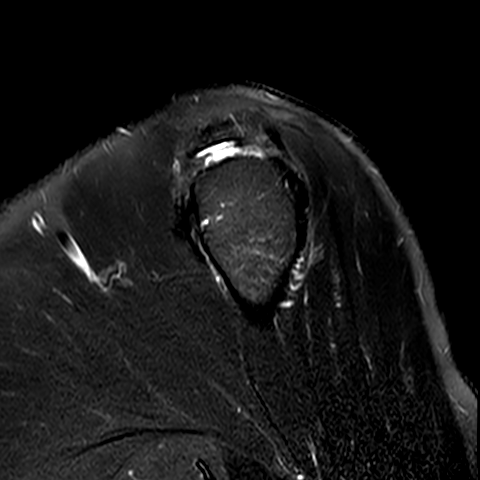
[im 30/30]
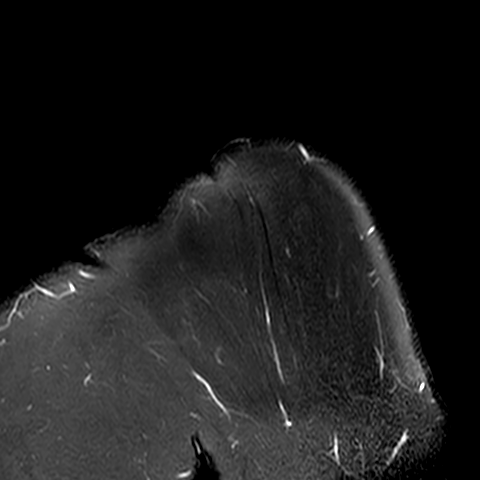

[Series 501: t1_sag left · coronal · left · 3.0mm · 0.30mm/px · 2 of 30 slices shown]
[im 1/30]
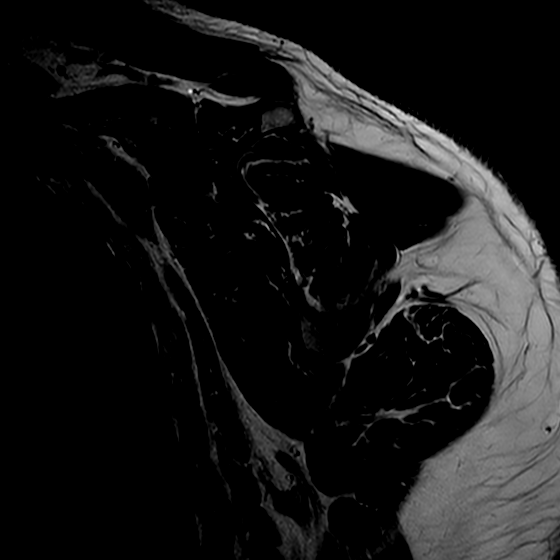
[im 5/30]
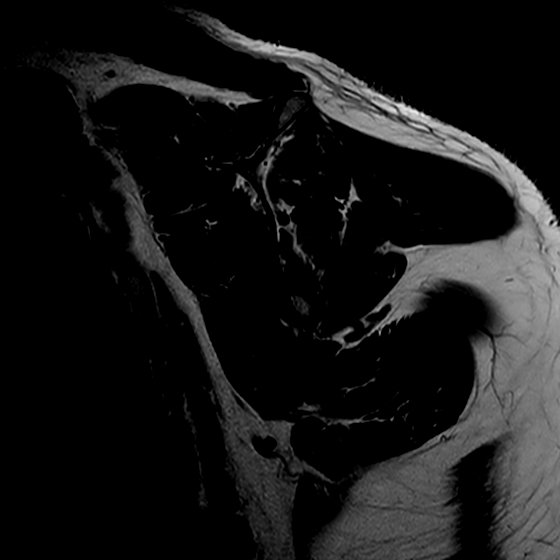

[22 of 40 positions shown; findings below may reference images not displayed]

FINDINGS: ROTATOR CUFF: There is full-thickness tear of the supraspinatus tendon with 
retraction the level of the acromion measuring approximately 2 cm AP. There is 
mild intrasubstance delamination within the anterior infraspinatus. 
Subscapularis and teres minor tendons are preserved. The rotator cuff 
musculature is symmetric without strain or asymmetric atrophy/fatty 
infiltration. 
ACROMIOCLAVICULAR JOINT: Preserved. There is low-grade partial tearing of the 
acromioclavicular ligament with a trace amount fluid within the AC joint. The 
undersurface of the acromion is flat. 
GLENOHUMERAL JOINT: There is superior subluxation of the humeral head through 
the rotator cuff defect. There is mild degenerative articular cartilaginous 
loss. Mild labral fraying. No para labral cyst. There is nonvisualization the 
intra-articular portion of the long head the biceps tendon, question 
full-thickness tear. Small shoulder joint effusion. 
BONES: The bone marrow signal intensity is negative for fracture. No Hill-Sachs 
defect. 
ADDITIONAL FINDINGS: The axillary region is negative. Subcutaneous tissues are 
negative.
IMPRESSION: Left shoulder: 
1.  Full thickness tear of the supraspinatus tendon. 
2.  Full-thickness disruption of the proximal long head the biceps tendon. 
3.  Mild degenerative changes of the glenohumeral articulation.

## 2022-02-14 IMAGING — MR MRI RIGHT SHOULDER WITHOUT CONTRAST
5 of 7 series · 21 of 40 positions shown · IV contrast (gadolinium)
Comparison: MRI examination of the left shoulder performed the same day.

________________________________________________________________________________________________ 
MRI RIGHT SHOULDER WITHOUT CONTRAST, 02/14/2022 [DATE]: 
CLINICAL INDICATION: Bilateral shoulder pain and limited range of motion, mainly 
on the right. Old weightlifting injury. Chronic bilateral shoulder discomfort, 
features consistent with chronic mild rotator cuff pathology.
TECHNIQUE: Multiplanar, multiecho position MR images of the right shoulder were 
performed without intravenous gadolinium enhancement. Patient was scanned on a 
1.5T magnet.

[Series 101: survey_fullfov_transversal · axial · right · 10.0mm · 1.84mm/px · 1 of 7 slices shown]
[im 1/7]
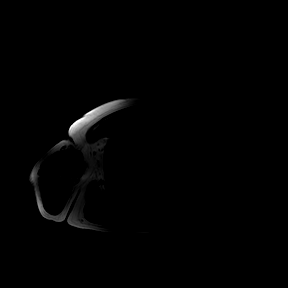

[Series 201: survey right · axial · right · 10.0mm · 0.71mm/px · z∈[-40,+125]mm · 4 of 15 slices shown]
[im 1/15]
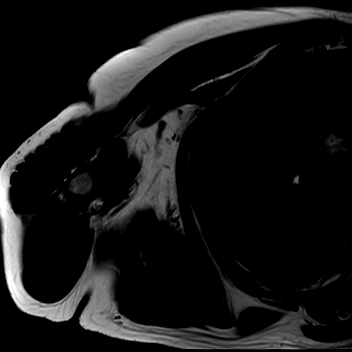
[im 5/15]
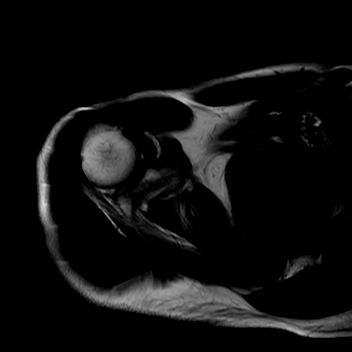
[im 10/15]
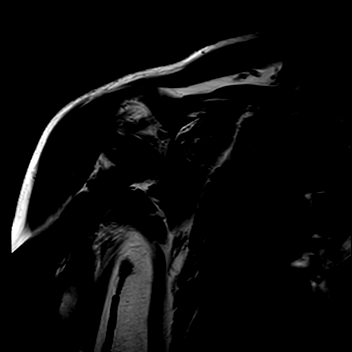
[im 15/15]
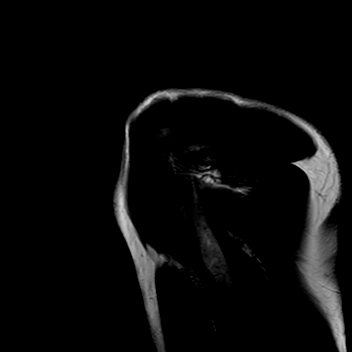

[Series 301: (person_name)_(person_name)_(person_name) right · axial · right · 3.0mm · 0.35mm/px · z∈[-7,+82]mm · 7 of 28 slices shown]
[im 1/28]
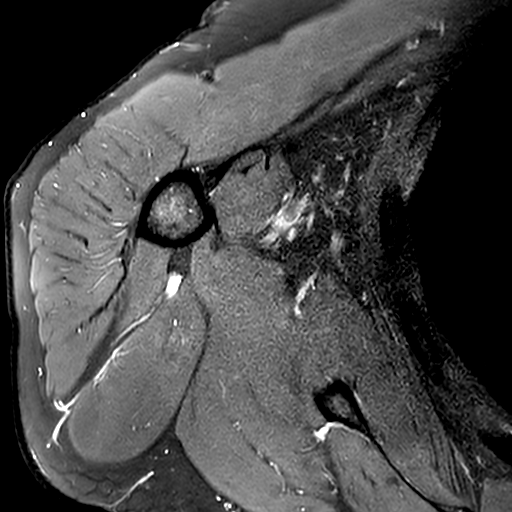
[im 5/28]
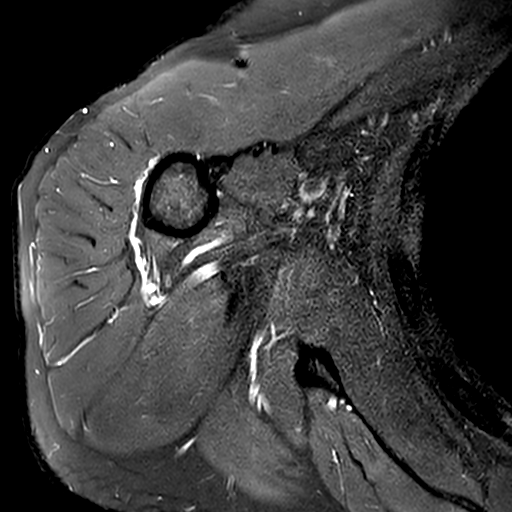
[im 10/28]
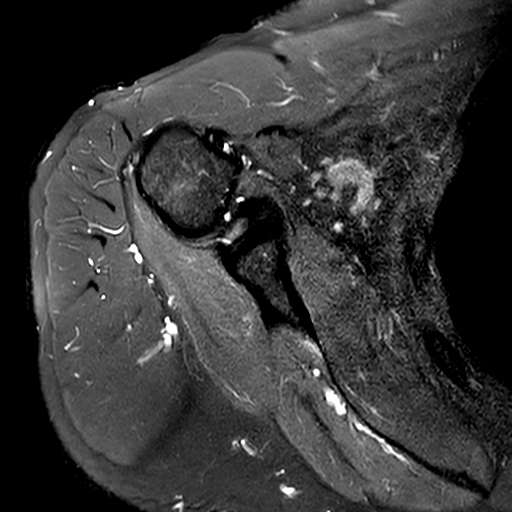
[im 14/28]
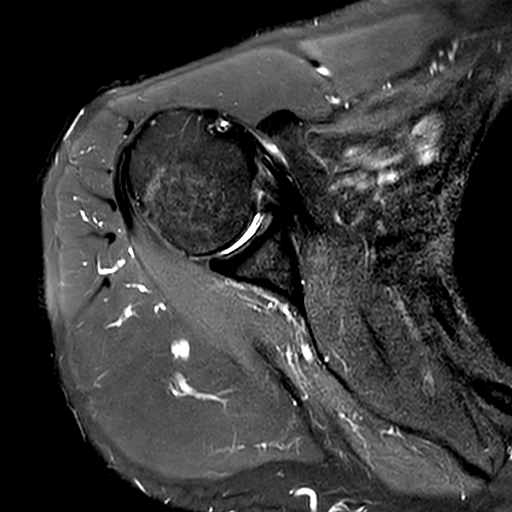
[im 19/28]
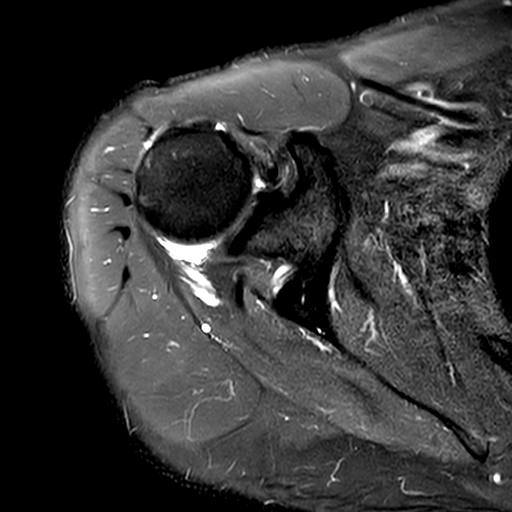
[im 23/28]
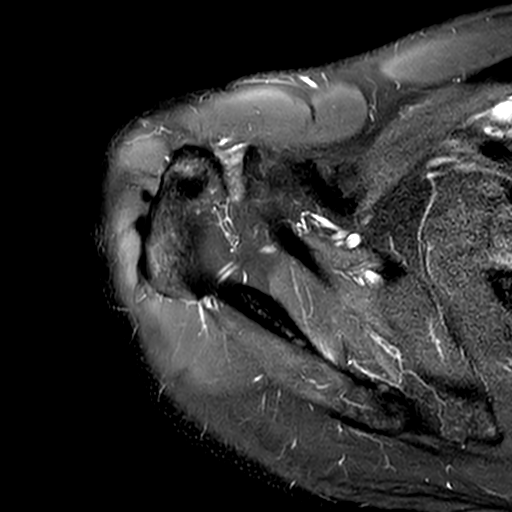
[im 28/28]
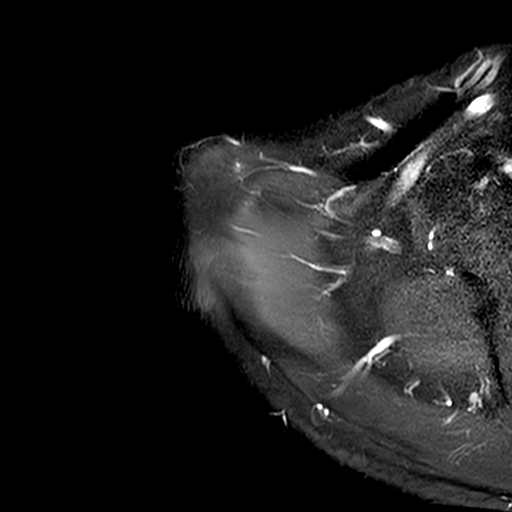

[Series 401: t2_fs_sag right · oblique · right · 3.0mm · 0.35mm/px · 8 of 30 slices shown]
[im 1/30]
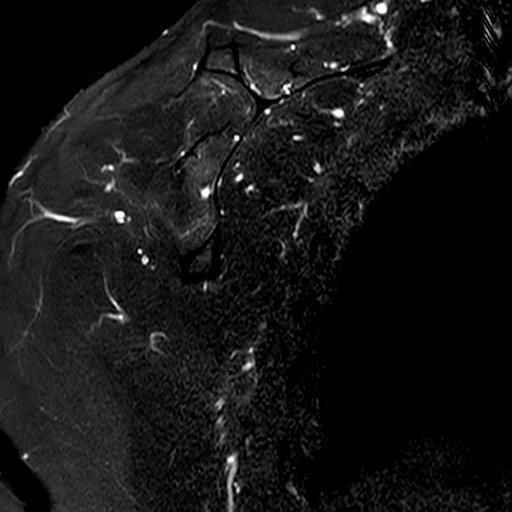
[im 5/30]
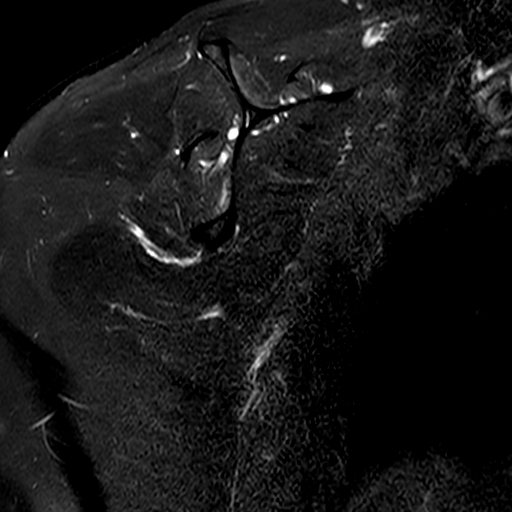
[im 9/30]
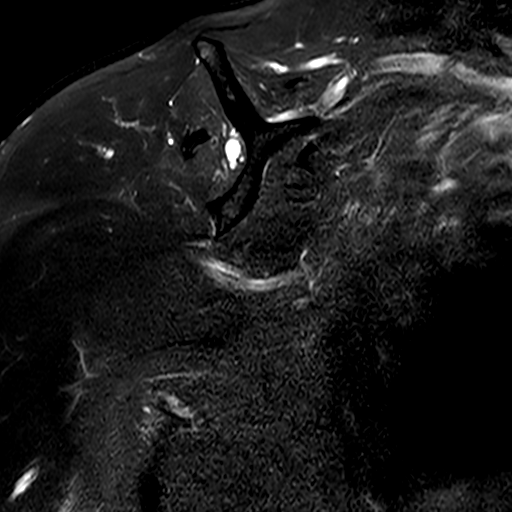
[im 13/30]
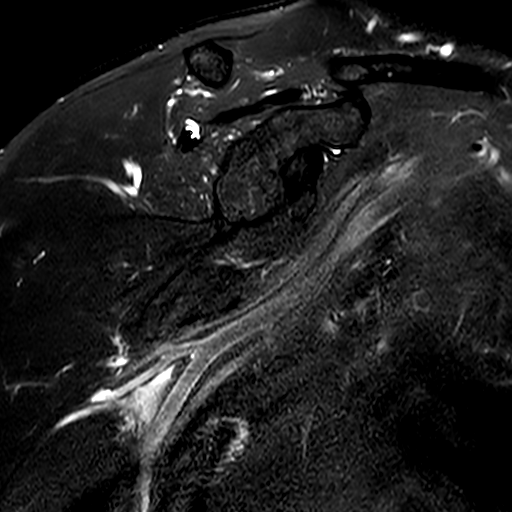
[im 17/30]
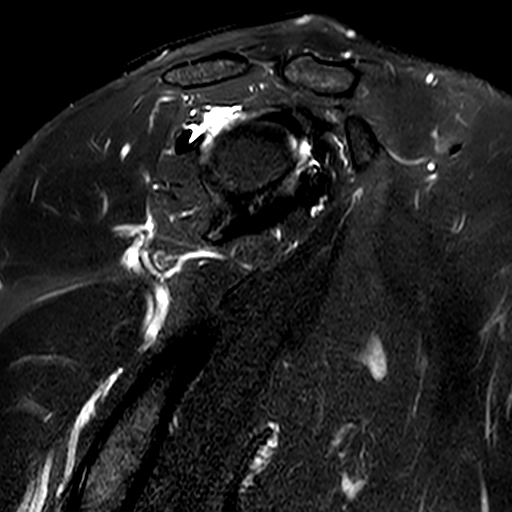
[im 21/30]
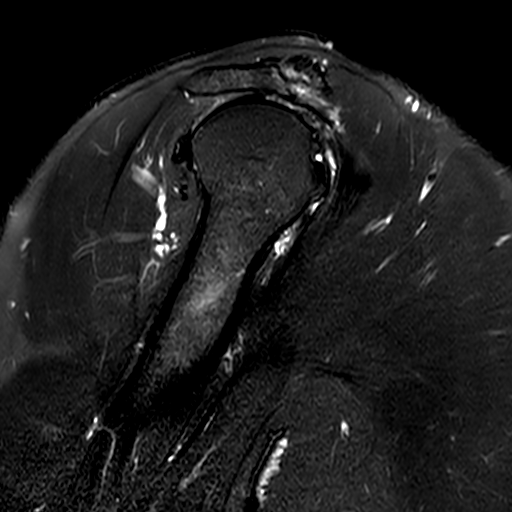
[im 25/30]
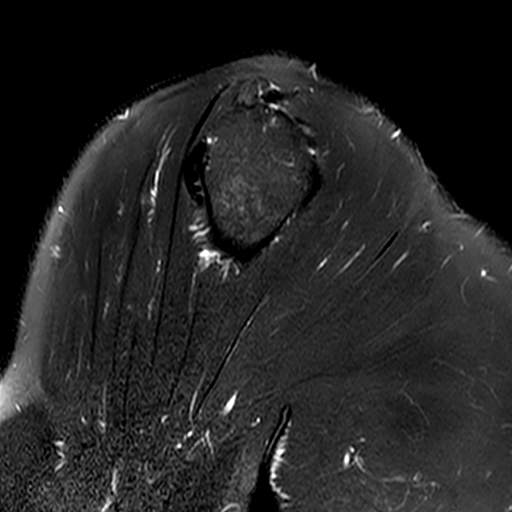
[im 30/30]
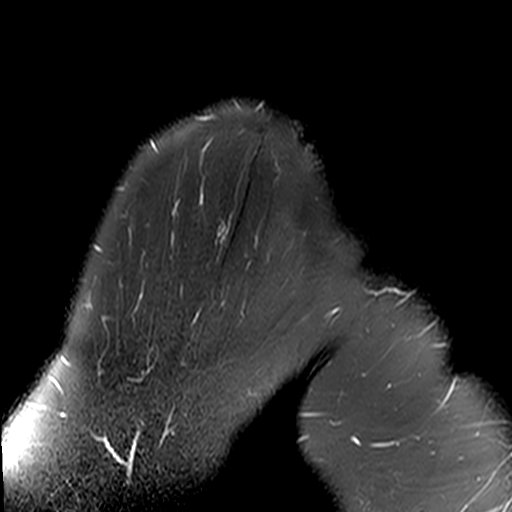

[Series 501: t1_sag right · oblique · right · 3.0mm · 0.31mm/px · 1 of 30 slices shown]
[im 1/30]
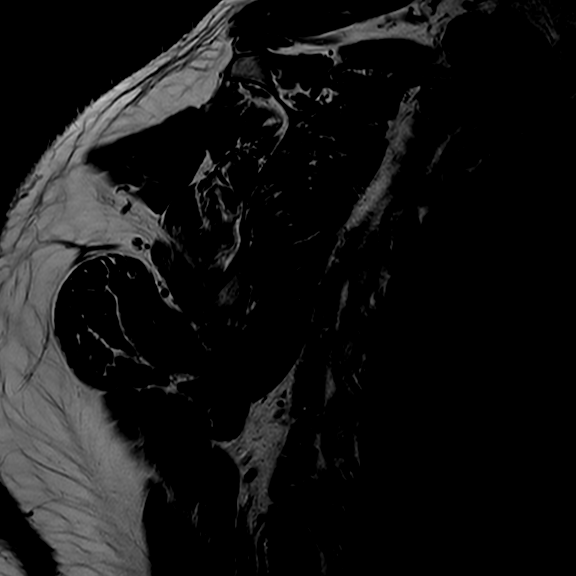

[21 of 40 positions shown; findings below may reference images not displayed]

FINDINGS: ROTATOR CUFF: There complete, full-thickness tears of the supraspinatus and 
infraspinatus tendons with a few atrophic fibers remaining anteriorly and 
posteriorly. There is associated advanced atrophy of the supraspinatus and 
infraspinatus muscles of greater than 50% volume loss with diffuse fatty 
infiltration. There is moderate partial tearing of the mid subscapularis 
insertion with mild mid belly fatty infiltration. The teres minor tendon and 
muscle are preserved. 
ACROMIOCLAVICULAR JOINT: Preserved. There is low-grade partial tearing of the 
acromioclavicular ligament with a trace amount fluid within the AC joint. There 
is osseous remodeling the undersurface of the acromion. 
GLENOHUMERAL JOINT: There is superior subluxation of the humeral head through 
the rotator cuff defect. There is mild degenerative articular cartilaginous 
loss. Mild labral fraying. No para labral cyst. There is nonvisualization the 
intra-articular portion of the long head the biceps tendon, question 
full-thickness tear. Small shoulder joint effusion. 
BONES: The bone marrow signal intensity is negative for fracture. No Hill-Sachs 
defect. 
ADDITIONAL FINDINGS: The axillary region is negative. Subcutaneous tissues are 
negative.
IMPRESSION: Right shoulder: 
1.  Chronic full-thickness tears of the supraspinatus and infraspinatus tendons 
with associated muscular atrophy. 
2.  Proximal long head the biceps tendon tear. 
3.  Partial tearing of the mid insertion of the subscapularis with mid belly 
mild muscular atrophy. 
4.  Mild degenerative changes of the glenohumeral articulation.

## 2023-03-08 ENCOUNTER — Ambulatory Visit: Payer: 59 | Attending: Surgery | Admitting: Surgical

## 2023-03-08 ENCOUNTER — Encounter: Payer: Self-pay | Admitting: Surgery

## 2023-03-08 ENCOUNTER — Other Ambulatory Visit: Payer: Self-pay

## 2023-03-08 VITALS — BP 159/111 | HR 77 | Temp 98.1°F | Ht 67.0 in | Wt 229.5 lb

## 2023-03-08 DIAGNOSIS — F1010 Alcohol abuse, uncomplicated: Secondary | ICD-10-CM

## 2023-03-08 DIAGNOSIS — K439 Ventral hernia without obstruction or gangrene: Secondary | ICD-10-CM

## 2023-03-08 NOTE — Progress Notes (Signed)
Chief Complaint:   Chief Complaint   Patient presents with    New Patient Visit     new pt/ umb hernia/ moved back from [place]/ [facility] PCP [place]/ no imaging/ new pt forms sent via Mychart         HPI: It was my pleasure to see Victor Madden, a 57 y.o. male presenting to the office today.  About 1-2 years ago, the patient noticed a bulge just above his umbilicus.  He was living in [place] at the time and a Careers adviser recommended laparoscopic repair, and he opted for observation since he was not symptomatic.  Over time, the bulge has gotten bigger, is visible through his clothing and he is quite self-conscious about it.  He denies any pain, but admits to a pressure-like sensation, that is exacerbated with coughing, laughing, and when he moves his bowels.  The bulge has always been soft, and he can push it partially back in.  The bulge gets smaller when he lies flat.  He denies any overlying skin changes, nausea or changes in his bowel habits.      Victor Madden admits to drinking 3-4 beers daily and feels shaky and sweaty on days when he does not drink.  He has been prescribed diazepam to take on days when he does not drink alcohol.  He has plans to quit drinking and hopes to lose weight prior to any surgical intervention for this hernia.      Past Medical History:  Past Medical History:   Diagnosis Date    Anxiety     Depression     Hypertension     Substance abuse        Problem List:  Patient Active Problem List   Diagnosis Code    Anxiety F41.1    Hypertension I10    Hyperlipidemia E78.5    Depression F32.89    Attention Deficit Disorder Without Hyperactivity F90.9    Warts B07.9    Incarcerated supraumbilical hernia K43.9    Alcohol abuse F10.10       Surgical History:  Past Surgical History:   Procedure Laterality Date    KNEE SURGERY  1979    Knee Surgery Conversion Data        Social History:  Social History     Socioeconomic History    Marital status: Married   Occupational History    Occupation: Research scientist (medical): [employer]   Tobacco Use    Smoking status: Never     Passive exposure: Past    Smokeless tobacco: Never   Substance and Sexual Activity    Alcohol use: Yes     Alcohol/week: 14.0 standard drinks of alcohol     Types: 14 Cans of beer per week     Comment: 3-4 beers daily    Drug use: Yes     Frequency: 3.0 times per week     Types: Marijuana     Comment: edibles for sleeping   Social History Narrative    Lives at home with his spouse, Victor Madden.         Family History:  Family History   Problem Relation Age of Onset    High Blood Pressure Mother     Skin Cancer Mother     High cholesterol Father     Conversion Other         [id number] Cancer^V16.42^Active^    Conversion Other         [id number]^Active^  Anesthesia problems Neg Hx     Clotting disorder Neg Hx        Medications:  Current Outpatient Medications   Medication    sertraline (ZOLOFT) 50 MG tablet    metoprolol (TOPROL-XL) 50 MG 24 hr tablet    diazepam (VALIUM) 5 MG tablet     No current facility-administered medications for this visit.       Allergies:  Allergies   Allergen Reactions    Environmental Allergies Other (See Comments)     Congestion      No Known Drug Allergy      Created by Conversion - 0;        Review of Systems:  Constitutional: Pt has been in their normal state of health. Denies any fevers/chills, night sweats, unintentional change in weight.  He has never been diagnosed with OSA.    Eyes: Denies any blurry vision or yellowing of sclera.  Ears, Nose, Mouth, Throat: Denies any runny nose, sore throat, or ear pain.   CV: Denies chest pain, DOE, or palpitations.  Respiratory: Denies any shortness of breath, wheezing or cough.  GI: Denies melanotic stool, BRBPR, constipation or diarrhea.  Colonoscopy in 2019, benign polypectomy perfromed.   GU: Denies any dysuria, hematuria or tea colored urine.  Musculoskeletal: Denies any trauma or significant arthritis.  Integumentary: Denies any rash, abrasion or  burn.  Hematologic/Lymphatic: Denies any history of blood clot or bleeding disorder. Denies any lymphadenopathy.    All other systems negative.    Physical Exam:  BP (!) 159/111 (BP Location: Right arm, Patient Position: Sitting, Cuff Size: large adult)   Pulse 77   Temp 36.7 C (98.1 F) (Temporal)   Ht 1.702 m (5\' 7" )   Wt 104.1 kg (229 lb 8 oz)   SpO2 97%   BMI 35.94 kg/m     Constitutional: Awake, alert, NAD. The patient is here alone.  Neck: No carotid bruit or palpable cervical lymphadenopathy appreciated bilaterally.  Eyes: Normal conjunctivae, sclera anicteric.   Ears, Nose, Mouth, Throat: Normal hearing. Mucus membranes moist. No pharyngeal erythema.  CV: RRR, no murmur appreciated.  Respiratory: Non-labored respirations. Lungs CTA bilaterally.  GI: Soft, ND, NT. No hepatosplenomegaly, or abdominal masses appreciated.  Just above his umbilicus, there is a moderate size, incompletely reducible supraumbilical hernia.  It is non-tender on reduction attempts and there are no overlying skin changes.  He has a mild diastasis rectus.    GU: No suprapubic or CVA tenderness.  Musculoskeletal: Bilateral calves soft and NT.  Skin: Warm, dry, pink. No rash, abrasion or breakdown.    Laboratory Data/Imaging:  N/a    Assessment and Plan:  1. Incarcerated supraumbilical hernia        2. Alcohol abuse            I had a long discussion with the patient here in the office today, explaining the concept of a supraumbilical hernia.  I think the patient would benefit from repair of this hernia.    I have explained the procedure carefully along with the risks and benefits, including but not limited to infection (including infection of the prosthetic mesh with subsequent need for removal of that mesh), bleeding, recurrence, chronic pain, seroma, poor cosmesis including loss of umbilicus, bowel injury, non resolution of symptoms, the need for anesthesia, along with the unlikely risks of perioperative myocardial infarction,  stroke, pulmonary and/or renal problems and even death.     The patient understands that their conscious choice to  drink alcohol daily greatly increases all of the above mentioned peri-operative complications. The patient plans to stop drinking alcohol next week with the plan to be sober 30 days prior to surgery.      Information was given, informed consent was obtained and questions were answered.  We will do this procedure soon.       August Albino, [place]  03/08/2023 9:45 AM

## 2023-03-08 NOTE — Invasive Procedure Plan of Care (Signed)
CONSENT FOR MEDICAL  OR SURGICAL PROCEDURE                            Patient Name: Victor Madden  [facility] [address]                                                              DOB: July 17, 1966         Please read this form or have someone read it to you.   It's important to understand all parts of this form. If something isn't clear, ask [place] to explain.   When you sign it, that means you understand the form and give [place] permission to do this surgery or procedure.     I agree for Risolo, Aloha Gell, MD along with any assistants* they may choose, to treat the following condition(s): Incarcerated surpaumbilical hernia    By doing this surgery or procedure on me: Incarcerated supraumbilical hernia repair with mesh    This is also known as: Incarcerated supraumbilical herniorrhaphy with mesh   Laterality: Not applicable     *if you'd like a list of the assistants, please ask. We can give that to you.    1. The care provider has explained my condition to me. They have told me how the procedure can help me. They have told me about other ways of treating my condition. I understand the care provider cannot guarantee the result of the procedure. If I don't have this procedure, my other choices are: Observation    2. The care provider has told me the risks (problems that can happen) of the procedure. I understand there may be unwanted results. The risks that are related to this procedure include: Infection (including infection of the prosthetic mesh with subsequent need for removal of that mesh), bleeding, recurrence, chronic pain, seroma, poor cosmesis including loss of umbilicus, bowel injury, non resolution of symptoms, the need for anesthesia, along with the unlikely risks of perioperative myocardial infarction, stroke, pulmonary and/or renal problems and even death.    3. I understand that during the procedure, my care provider may find a condition that we didn't know about before the treatment started. Therefore, I agree  that my care provider can perform any other treatment which they think is necessary and available.    4. I give permission to the hospital and/or its departments to examine and keep tissue, blood, body parts, fluids or materials removed from my body during the procedure(s) to aid in diagnosis and treatment, after which they may be used for scientific research or teaching by appropriate persons. If these materials are used for science or teaching, my identity will be protected. I will no longer own or have any rights to these materials regardless of how they may be used.    5. My care provider might want a representative from a medical device company to be there during my procedure. I understand that person works for:          The ways they might help my care provider during my procedure include:            6. Here are my decisions about receiving blood, blood products, or tissues. I understand my decisions cover the  time before, during and after my procedure, my treatment, and my time in the hospital. After my procedure, if my condition changes a lot, my care provider will talk with me again about receiving blood or blood products. At that time, my care provider might need me to review and sign another consent form, about getting or refusing blood.    I understand that the blood is from the community blood supply. Volunteers donated the blood, the volunteers were screened for health problems. The blood was examined with very sensitive and accurate tests to look for hepatitis, HIV/AIDS, and other diseases. Before I receive blood, it is tested again to make sure it is the correct type.    My chances of getting a sickness from blood products are small. But no transfusion is 100% safe. I understand that my care provider feels the good I will receive from the blood is greater than the chances of something going wrong. My care provider has answered my questions about blood products.      My decision  about blood or  blood  products   Yes        My decision   about tissue  Implants     N/A          I understand this  form.    My care provider  or his/her  assistants have  explained:   What I am having done and why I need it.  What other choices I can make instead of having this done.  The benefits and possible risks (problems) to me of having this done.  The benefits and possible risks (problems) to me of receiving transplants, blood, or blood products.  There is no guarantee of the results.  The care provider may not stay with me the entire time that I am in the operating or procedure room.  My provider has explained how this may affect my procedure. My provider has answered my  questions about this.         I give my  permission for  this surgery or  procedure.            _______________________________________________                                     My signature  (or parent or other person authorized to sign for you, if you are unable to sign for  yourself or if you are under 68 years old)        ______           Date        _____        Time   Electronic Signatures will display at the bottom of the consent form.    Care provider's statement: I have discussed the planned procedure, including the possibility for transfusion of blood  products or receipt of tissue as necessary; expected benefits; the possible complications and risks; and possible alternatives  and their benefits and risks with the patients or the patient's surrogate. In my opinion, the patient or the patient's surrogate  understands the proposed procedure, its risks, benefits and alternatives.              Electronically signed by: August Albino, PA  03/08/2023         Date        10:00 AM        Time

## 2023-04-17 ENCOUNTER — Ambulatory Visit: Admission: RE | Admit: 2023-04-17 | Payer: 59 | Source: Ambulatory Visit | Admitting: Surgery

## 2023-04-17 ENCOUNTER — Encounter: Admission: RE | Payer: Self-pay | Source: Ambulatory Visit

## 2023-04-17 SURGERY — REPAIR, HERNIA, UMBILICAL
Anesthesia: General | Site: Abdomen

## 2023-05-01 ENCOUNTER — Ambulatory Visit: Payer: 59 | Admitting: Surgery

## 2023-05-09 ENCOUNTER — Ambulatory Visit: Admission: RE | Admit: 2023-05-09 | Payer: PRIVATE HEALTH INSURANCE | Source: Ambulatory Visit | Admitting: Surgery

## 2023-05-16 ENCOUNTER — Ambulatory Visit: Payer: 59 | Admitting: Surgery

## 2023-06-04 ENCOUNTER — Ambulatory Visit: Payer: PRIVATE HEALTH INSURANCE | Attending: Surgery | Admitting: Surgical

## 2023-06-04 ENCOUNTER — Encounter: Payer: Self-pay | Admitting: Surgery

## 2023-06-04 ENCOUNTER — Other Ambulatory Visit: Payer: Self-pay

## 2023-06-04 VITALS — BP 152/108 | HR 68 | Temp 98.1°F | Resp 18 | Ht 67.0 in | Wt 232.9 lb

## 2023-06-04 DIAGNOSIS — F1010 Alcohol abuse, uncomplicated: Secondary | ICD-10-CM

## 2023-06-04 DIAGNOSIS — K439 Ventral hernia without obstruction or gangrene: Secondary | ICD-10-CM

## 2023-06-04 MED ORDER — METOPROLOL SUCCINATE 50 MG PO TB24 *I*
100.0000 mg | ORAL_TABLET | Freq: Every day | ORAL | Status: AC
Start: 2023-06-04 — End: ?

## 2023-06-04 NOTE — Progress Notes (Addendum)
Chief Complaint:   Chief Complaint   Patient presents with    Follow-up       HPI: It was my pleasure to see Victor Madden, a 57 y.o. male returning to the office today.  Please refer to our clinic note from 03/08/23 for a complete history of this patient's supraumbilical hernia.  Since our last visit, Victor Madden has been doing well.  He reports no change in his umbilical hernia and only experiences an occasional discomfort at the site. He states the bulge may be slightly increased in size and does not reduce completely.  He denies any overlying skin changes, nausea or change in his bowel habits.     He has continued to drink alcohol daily, but has decreased the amount.  He plans to abstain from alcohol in the 2 weeks prior to surgery and hopes to remain sober following that.      Past Medical History:  Past Medical History:   Diagnosis Date    Anxiety     Depression     Hypertension     Substance abuse        Problem List:  Patient Active Problem List   Diagnosis Code    Anxiety F41.1    Hypertension I10    Hyperlipidemia E78.5    Depression F32.89    Attention Deficit Disorder Without Hyperactivity F90.9    Warts B07.9    Incarcerated supraumbilical hernia K43.9    Alcohol abuse F10.10       Surgical History:  Past Surgical History:   Procedure Laterality Date    KNEE SURGERY  1979    Knee Surgery Conversion Data        Social History:  Social History     Socioeconomic History    Marital status: Married   Occupational History    Occupation: Therapist, occupational: [employer]   Tobacco Use    Smoking status: Never     Passive exposure: Past    Smokeless tobacco: Never   Substance and Sexual Activity    Alcohol use: Yes     Alcohol/week: 14.0 standard drinks of alcohol     Types: 14 Cans of beer per week     Comment: 3-4 beers daily    Drug use: Yes     Frequency: 3.0 times per week     Types: Marijuana     Comment: edibles for sleeping   Social History Narrative    Lives at home with his spouse, Victor Madden.          Family History:  Family History   Problem Relation Age of Onset    High Blood Pressure Mother     Skin Cancer Mother     High cholesterol Father     Conversion Other         [id number]^[id number] Cancer^V16.42^Active^    Conversion Other         [id number]^Active^    Anesthesia problems Neg Hx     Clotting disorder Neg Hx        Medications:  Current Outpatient Medications   Medication    sertraline (ZOLOFT) 50 MG tablet    metoprolol (TOPROL-XL) 50 MG 24 hr tablet     No current facility-administered medications for this visit.       Allergies:  Allergies   Allergen Reactions    Environmental Allergies Other (See Comments)     Congestion      No Known Drug Allergy  Created by Conversion - 0;        Review of Systems:  Constitutional: Pt has been in their normal state of health. Denies any fevers/chills, night sweats, unintentional change in weight.  He has never been diagnosed with OSA.    Eyes: Denies any blurry vision or yellowing of sclera.  Ears, Nose, Mouth, Throat: Denies any runny nose, sore throat, or ear pain.   CV: Denies chest pain, DOE, or palpitations.  Respiratory: Denies any shortness of breath, wheezing or cough.  GI: Denies melanotic stool, BRBPR, constipation or diarrhea.  Last colonoscopy was in 2019 and benign polypectomy was performed.   GU: Denies any dysuria, hematuria or tea colored urine.  Musculoskeletal: Denies any trauma or significant arthritis.  Integumentary: Denies any rash, abrasion or burn.  Hematologic/Lymphatic: Denies any history of blood clot or bleeding disorder. Denies any lymphadenopathy.    All other systems negative.    Physical Exam:  BP (!) 139/103 (BP Location: Right arm, Patient Position: Sitting, Cuff Size: large adult)   Pulse 68   Temp 36.7 C (98.1 F) (Temporal)   Resp 18   Ht 1.702 m (5\' 7" )   Wt 105.6 kg (232 lb 14.4 oz)   SpO2 97%   BMI 36.48 kg/m     Constitutional: Awake, alert, NAD. The patient is here alone.  Neck: No  carotid bruit or palpable cervical lymphadenopathy appreciated bilaterally.  Eyes: Normal conjunctivae, sclera anicteric.   Ears, Nose, Mouth, Throat: Normal hearing. Mucus membranes moist. No pharyngeal erythema.  CV: RRR, no murmur appreciated.  Respiratory: Non-labored respirations. Lungs CTA bilaterally.  GI: Soft, ND, NT. No hepatosplenomegaly, or abdominal masses appreciated.  At his umbilicus, in the bed of a moderate rectus diastasis, there is a moderate sized umbilical hernia that is not completely reducible.  I am not able to appreciate the discrete fascial edges.  There are no overlying skin changes.   GU: No suprapubic or CVA tenderness.  Musculoskeletal: Bilateral calves soft and NT.  Skin: Warm, dry, pink. No rash, abrasion or breakdown.    Laboratory Data/Imaging:  N/a    Assessment and Plan:  1. Incarcerated supraumbilical hernia        2. Alcohol abuse            I had a long discussion with the patient here in the office today, explaining the concept of a supraumbilical hernia.  I think the patient would benefit from repair of this incarcerated supraumbilical hernia.     We had a very long discussion in the office with the patient today.  His blood pressure is elevated, his BMI is >35 and he continues to drink alcohol.  The patient understands that his conscious choice to actively drink alcohol daily and BMI>35 greatly increases all of the below mentioned peri-operative complications.  He is interested in making significant changes in his life.  We discussed a new diet - implementing whole foods and avoiding processed foods or alcohol.  He is going to try to make these changes and we will postpone surgery for now.  We will re-evaluate him again and at that time and reschedule surgery no sooner than 6 weeks from now.     I have explained the procedure carefully along with the risks and benefits, including but not limited to infection (including infection of the prosthetic mesh with subsequent need  for removal of that mesh), bleeding, recurrence, chronic pain, seroma, poor cosmesis including loss of umbilicus, bowel injury, non resolution of  symptoms, the need for anesthesia, along with the unlikely risks of perioperative myocardial infarction, stroke, pulmonary and/or renal problems and even death.  Information was given, informed consent was obtained and questions were answered.          August Albino, [place]  06/04/2023 10:55 AM    GENERAL SURGERY ATTENDING   The APP has completed the documentation of this note under my direction. I personally saw, interviewed and examined the patient, reviewed the chart and the relevant imaging/lab findings, personally formulated the assessment and directed the management. Patient counseling and coordination of care provided.    Matisse Salais P. Nyjai Graff MD, FACS

## 2023-06-04 NOTE — Invasive Procedure Plan of Care (Signed)
CONSENT FOR MEDICAL  OR SURGICAL PROCEDURE                            Patient Name: Victor Madden  [facility] [address]                                                              DOB: 16-Mar-1966         Please read this form or have someone read it to you.   It's important to understand all parts of this form. If something isn't clear, ask [place] to explain.   When you sign it, that means you understand the form and give [place] permission to do this surgery or procedure.     I agree for Risolo, Aloha Gell, MD along with any assistants* they may choose, to treat the following condition(s): Incarcerated supraumbilical hernia    By doing this surgery or procedure on me: Incarcerated supraumbilical hernia repair with mesh    This is also known as: Incarcerated supraumbilical herniorrhaphy with mesh   Laterality: Not applicable     *if you'd like a list of the assistants, please ask. We can give that to you.    1. The care provider has explained my condition to me. They have told me how the procedure can help me. They have told me about other ways of treating my condition. I understand the care provider cannot guarantee the result of the procedure. If I don't have this procedure, my other choices are: Observation    2. The care provider has told me the risks (problems that can happen) of the procedure. I understand there may be unwanted results. The risks that are related to this procedure include: Infection (including infection of the prosthetic mesh with subsequent need for removal of that mesh), bleeding, recurrence, chronic pain, seroma, poor cosmesis including loss of umbilicus, bowel injury, non resolution of symptoms, the need for anesthesia, along with the unlikely risks of perioperative myocardial infarction, stroke, pulmonary and/or renal problems and even death.    3. I understand that during the procedure, my care provider may find a condition that we didn't know about before the treatment started. Therefore, I agree  that my care provider can perform any other treatment which they think is necessary and available.    4. I give permission to the hospital and/or its departments to examine and keep tissue, blood, body parts, fluids or materials removed from my body during the procedure(s) to aid in diagnosis and treatment, after which they may be used for scientific research or teaching by appropriate persons. If these materials are used for science or teaching, my identity will be protected. I will no longer own or have any rights to these materials regardless of how they may be used.    5. My care provider might want a representative from a medical device company to be there during my procedure. I understand that person works for:          The ways they might help my care provider during my procedure include:            6. Here are my decisions about receiving blood, blood products, or tissues. I understand my decisions cover the  time before, during and after my procedure, my treatment, and my time in the hospital. After my procedure, if my condition changes a lot, my care provider will talk with me again about receiving blood or blood products. At that time, my care provider might need me to review and sign another consent form, about getting or refusing blood.    I understand that the blood is from the community blood supply. Volunteers donated the blood, the volunteers were screened for health problems. The blood was examined with very sensitive and accurate tests to look for hepatitis, HIV/AIDS, and other diseases. Before I receive blood, it is tested again to make sure it is the correct type.    My chances of getting a sickness from blood products are small. But no transfusion is 100% safe. I understand that my care provider feels the good I will receive from the blood is greater than the chances of something going wrong. My care provider has answered my questions about blood products.      My decision  about blood or  blood  products   Yes        My decision   about tissue  Implants     N/A          I understand this  form.    My care provider  or his/her  assistants have  explained:   What I am having done and why I need it.  What other choices I can make instead of having this done.  The benefits and possible risks (problems) to me of having this done.  The benefits and possible risks (problems) to me of receiving transplants, blood, or blood products.  There is no guarantee of the results.  The care provider may not stay with me the entire time that I am in the operating or procedure room.  My provider has explained how this may affect my procedure. My provider has answered my  questions about this.         I give my  permission for  this surgery or  procedure.            _______________________________________________                                     My signature  (or parent or other person authorized to sign for you, if you are unable to sign for  yourself or if you are under 6 years old)        ______           Date        _____        Time   Electronic Signatures will display at the bottom of the consent form.    Care provider's statement: I have discussed the planned procedure, including the possibility for transfusion of blood  products or receipt of tissue as necessary; expected benefits; the possible complications and risks; and possible alternatives  and their benefits and risks with the patients or the patient's surrogate. In my opinion, the patient or the patient's surrogate  understands the proposed procedure, its risks, benefits and alternatives.              Electronically signed by: August Albino, PA  06/04/2023         Date        11:03 AM        Time

## 2023-06-20 ENCOUNTER — Encounter: Admission: RE | Payer: Self-pay | Source: Ambulatory Visit

## 2023-06-20 SURGERY — REPAIR, HERNIA, UMBILICAL
Anesthesia: General | Site: Abdomen

## 2023-06-27 ENCOUNTER — Ambulatory Visit: Payer: 59 | Admitting: Surgery

## 2023-09-03 NOTE — Progress Notes (Signed)
 Subjective      Name Victor Madden MRN [id number]    DOB 1965-10-26 AGE/SEX 57 y.o./ male   Preferred Name [facility]  Pronouns       Chief Complaint Chief Complaint   Patient presents with   . Blood Pressure Check        Visit History for  09/03/2023   Problem List <redacted file path>* Health Maintenance <redacted file path>* Results/Data <redacted file path>*  Review Flowsheets <redacted file path>*   History of Present Illness  HPI   57 yo male w/ history of HTN, HLD and depression/anxiety here today for follow up. At last visit on 10/24 he was started on Losartan 50mg  daily due to elevated blood pressure. Returns here today for BP check and BP is elevated.    After 5 straight days of drinking, reports he has not drank in 48 hours. Compliant with the blood pressure medication.      Review of Systems   Constitutional:  Negative for chills and fever.   HENT:  Negative for sore throat.    Eyes:  Negative for redness.   Respiratory:  Negative for cough and shortness of breath.    Cardiovascular:  Negative for chest pain and palpitations.   Gastrointestinal:  Negative for abdominal pain, diarrhea, nausea and vomiting.   Genitourinary:  Negative for dysuria and frequency.   Musculoskeletal:  Negative for back pain.   Skin:  Negative for rash.   Neurological:  Negative for dizziness and headaches.   Psychiatric/Behavioral:  The patient is not nervous/anxious.            Medications <redacted file path> and  Allergies <redacted file path>     Current Outpatient Medications   Medication Instructions   . diazePAM  (VALIUM ) 5 MG Oral tablet Take two pills four times a day for one day then one pill four times a day for the next two days   . lactobacillus rhamnosus, GG, 10 billion cell Oral capsule 1 capsule, Oral, DAILY   . losartan (COZAAR) 50 mg, Oral, Daily   . metoprolol  SUCCINATE 100 MG Oral 24 hr EXT REL tab TAKE 1 TABLET BY MOUTH EVERY DAY FOR BLOOD PRESSURE   . sertraline  50 MG Oral tablet TAKE 1 AND HALF TABLETS BY MOUTH  DAILY      No Known Allergies    Objective:     Vitals  Modify Vitals <redacted file path>  weight is 235 lb 9.6 oz (106.9 kg). His temporal temperature is 36.7 C (98.1 F). His blood pressure is 150/102 (abnormal) and his pulse is 79. His oxygen saturation is 98%.  Body mass index is 36.9 kg/m.       Physical Exam  Vitals and nursing note reviewed.   Constitutional:       General: He is not in acute distress.  Eyes:      Conjunctiva/sclera: Conjunctivae normal.   Cardiovascular:      Rate and Rhythm: Normal rate and regular rhythm.   Pulmonary:      Effort: Pulmonary effort is normal.      Breath sounds: Normal breath sounds.   Musculoskeletal:      Cervical back: Neck supple.   Skin:     General: Skin is warm and dry.   Neurological:      Mental Status: He is alert.      Gait: Gait is intact.   Psychiatric:         Mood and Affect: Mood normal.  Behavior: Behavior normal.               Assessment/Plan <redacted file path>   Clinical References <redacted file path>*   Pt Intr & Followup <redacted file path>*   Wrap Up <redacted file path>*     Issues Addressed/ Plan <redacted file path>      Diagnosis Plan   1. Hypertension, unspecified type  Above goal despite compliance with metoprolol  and losartan. Likely elevated today as he has not had a drink in 48 hours after going 5 days straight of drinking. Aside from elevated blood pressure no other signs of acute alcohol withdrawal. Will have him keep a home blood pressure log and return for nurse visit to have his BP rechecked in about 2 weeks.  States he will go to have his pending lab work drawn.      2. Alcohol abuse  Counseled on cutting back his alcohol intake. At time of last visit he was doing ok with abstaining from alcohol however he recently went 5 straight days drinking and abruptly stopped 2 days ago. Admits to drinking ~4 IPAs per day. Has never experienced withdrawal seizures from alcohol. Discussed options such as naltrexone to help control  cravings or antabuse to prevent him from drinking.      3. Hyperlipidemia, unspecified hyperlipidemia type  Fasting lipid panel pending.      4. Bilateral shoulder pain, unspecified chronicity  Ambulatory Referral to Orthopedic Surgery    Ambulatory Referral to Physical Therapy                 Follow up  recommended <redacted file path> Return in about 2 weeks (around 09/17/2023) for nurse visit (BP check).     Marcela Senters, MD

## 2023-10-02 NOTE — Progress Notes (Addendum)
 Subjective      NAME: Victor Madden MRN: [id number]    DOB: 1966/05/11 AGE: 58 y.o. SEX: male     Visit History for  09/30/2023      It was a pleasure seeing Victor Madden in the office today.  [name] is a 58 y.o. male who presents for: Pain of the Left Shoulder and Pain of the Right Shoulder        The patient presents to the office today for evaluation of bilateral shoulder pain.  Patient states that he is had pain in both shoulders for quite some time.  He does have some limitations in the shoulder as well.  He has well-maintained strength but is here today as he did have prior evaluation about 2 years ago of the shoulders.  He was noted to have bilateral shoulder rotator cuff tears.  They discussed surgery at that time but patient elected to not undergo surgery.  He is here today for another opinion and to follow-up on the shoulders.  His right shoulder has more cracking and popping sensation he has good motion but is having his discomfort with the cracking and popping.  There is a left shoulder he is having pain as well with overhead motion reaching activity.  He is tried rest ice anti-inflammatory medication therapeutic exercises with mild improvement.  But continues with pain.  He has brought the MRI reports from his prior MRIs in 2023.  These were personally reviewed in preparation for today's visit.  Pain can increase to a 7 out of 10 in nature.        Answers submitted by the patient for this visit:  [facility] Ortho General Questionnaire (Submitted on 09/28/2023)  In regards to your current injury, where is the pain located?: left and right shoulder  What is the current level of pain you are experiencing?: 5  Please select any of the following that are associated with your pain:: Sharp, Achy  Radiation of pain: Yes  Numbness / Tingling: Yes  Popping: Yes  Is there anything that causes the pain to be more severe?: upward movement  What medications are you currently taking to help with the pain?:  none  Did the medication(s) help your symptoms improve?: No  Have you tried physical therapy?: No  Have you already been seen by an Orthopedic?: Yes  If yes, please specify the location and date:: [facility] 2019  Have you recieved any injections?: No  Have you already had an MRI?: Yes  If yes, please specify the location and date:: 2020  [facility], [place]      Past Medical History:   Diagnosis Date   . Torn rotator cuff     right     Past Surgical History:   Procedure Laterality Date   . KNEE ARTHROSCOPY W/ MENISCAL REPAIR Right 1978          ROS:  A 12 point ROS was reviewed.           Patient's medications, allergies, past medical, surgical, social/family histories were reviewed and updated where appropriate in the EMR.       Allergies: No Known Allergies     Tobacco History: Patient  reports that he has never smoked. He has never used smokeless tobacco.       Objective:     Constitutional:  Appears well-developed, well-nourished. No acute distress.   Neurocognitive:  Awake, alert and oriented times 3.  Psychiatric:  Normal mood and affect.  HEENT:  Normocephalic, atraumatic.  Pupils equal bilaterally.    Cardiovascular:  Warm and well perfused.  Pulmonary:  Normal respiratory effort without distress.  Skin:  Warm and dry.  No rash or erythema.    Neurovascular: Distal Neurovascular status intact.  BMI: Body mass index is 36.81 kg/m.  Musculoskeletal:  Right  shoulder:  Skin without abrasions/lesions  - Active Range of motion from 150 degrees of forward flexion, 140 degrees of abduction, 30 degrees of external rotation, internal rotation to their TL spine  - crepitus noted with motion  -- There is not tenderness to palpation along the acromioclavicular joint or acromion.    - Neer and Hawkins testings are positive .    - 4+/5 strength with resisted abduction and external rotation.    - Belly press test: negative.  Liftoff sign: negative.  - O'briens test: positive.  Speed's test: positive.  -  Yergason positive. Bicipital groove pain positive.  - Apprehension test: negative, Relocation sign: negative.  - 2+ Radial pulse  - SILT throughout arm/hand distally    Left  shoulder:  Skin without abrasions/lesions  - Active Range of motion from 150 degrees of forward flexion, 140 degrees of abduction, 30 degrees of external rotation, internal rotation to their TL spine    -- There is not tenderness to palpation along the acromioclavicular joint or acromion.    - Neer and Hawkins testings are positive .    - 4+/5 strength with resisted abduction and external rotation.    - Belly press test: negative.  Liftoff sign: negative.  - O'briens test: positive.  Speed's test: positive.  - Yergason positive. Bicipital groove pain positive.  - Apprehension test: negative, Relocation sign: negative.  - 2+ Radial pulse  - SILT throughout arm/hand distally     Imaging: XR Right SHOULDER - 09/30/23 - AP/SCAPY/AXILLARY - no acute fracture or dislocation.  Chronic appearing changes to the greater tuberosity suggestive of rotator cuff tearing that is chronic in nature.  Mild degenerative changes to acromioclavicular joint space    XR Left SHOULDER - 09/30/23 - AP/SCAPY/AXILLARY - no acute fracture or dislocation.  Well-maintained glenohumeral joint space.  Mild degenerative changes to acromioclavicular joint space    Mri right shoulder 03/2022 -please refer to report which was reviewed    Supraspinatus complete tear retracted to the glenohumeral joint with a large full-thickness infraspinatus retracted tear. - atrophy noted    Torn biceps and partially torn subscapularis tendon. SLAP tear.     Joint effusion as well as fluid in the subacromial subdeltoid bursa.       Mri left shoulder 2023 - report reviewed -   There is full-thickness tearing of the supraspinatus tendon with retraction noted approximately 1 to 2 cm.         Assessment/Plan <redacted file path>   Clinical References <redacted file path>* Pt Intr & Followup <redacted  file path>* Wrap Up <redacted file path>* MAR <redacted file path>*     1. Pain in both knees, unspecified chronicity    2. Bilateral shoulder pain, unspecified chronicity         Clinical and radiographic findings were discussed with the patient at length today. They understand these findings/diagnosis at this time.     Patient has clinical signs symptoms concerning for internal derangement to the shoulders.  With regards to the right shoulder it appears that he has chronic rotator cuff arthropathy with significant atrophy noted.  His glenohumeral joint space appears to be well-maintained  on x-ray but does have evidence of chronic rotator cuff tearing.  Discussed this is likely causing the crepitus and cracking in the right shoulder.  He has well-maintained motion with regards to the right shoulder discussed options of rest ice anti-inflammatory medication physical therapy cortisone injection.  At this point he would like to hold off on injection.  Given the fact that he has chronic rotator cuff tearing and weakness lifting weight from her body I discussed with him I would obtain MRI to see if he has any viability of rotator cuff repair.  Will obtain MRI will follow-up with me afterwards    With regards to the left shoulder patient has clinical signs symptoms of rotator cuff tearing.  MRI from 2023 personally reviewed does show that he has a full-thickness retracted tear.  It has been nearly 2 years since this MRI.  At this point I discussed with him this could have retracted further.  Unclear if this is repairable.  He has continued discomfort and weakness left away from the body.  I would like to obtain an updated MRI of the left shoulder to rule out full-thickness tear.  He is agreeable.  Will follow-up with me after the MRIs of both the left and right shoulder            All questions were invited and answered to the patient's satisfaction. The patient is agreeable with this plan.         Follow up: <redacted  file path> MRI REVIEW              Charissa Compton, M.D.  [facility]   Medical Director of [facility] Shoulder Surgery  Orthopaedic Surgery and Sports Medicine  [email or web address]

## 2023-10-22 NOTE — Progress Notes (Addendum)
 Subjective      Name Victor Madden MRN [id number]    DOB [birth year] AGE/SEX 58 y.o./ male   Preferred Name [name]  Pronouns       Chief Complaint Chief Complaint   Patient presents with   . Annual Exam        Visit History for  10/22/2023   Problem List <redacted file path>* Health Maintenance <redacted file path>* Results/Data <redacted file path>*  Review Flowsheets <redacted file path>*   History of Present Illness    I had the pleasure of seeing Victor Madden, who presents to the office for annual physical examination.     Victor Madden recently moved back to the [place] area from [place] .  He established care here in October.  He has a past medical history including hypertension, hyperlipidemia, ADHD, anxiety with depression, alcohol abuse.     #Hypertension  -Previously on metoprolol  100 mg extended release daily  -Started on losartan 50 mg due to elevated blood pressure 10/24  -Denies missed doses  -Has home blood pressure cuff, does not use.  -Denies medication side effects     BP Readings from Last 4 Encounters:   10/22/23 (!) 154/106   09/03/23 (!) 150/102   07/25/23 (!) 138/98   05/18/19 (!) 142/102       #ETOH use  -has been drinking for 20 years  -Generally consumes 5-6 IPAs 6-7% ABV daily.  If consuming lower ABV beers will drink 8.  -Last alcohol consumption was Friday night  -When discontinuing alcohol he will have generally mild symptoms of sweating, agitation and diarrhea.  He has never experienced visual changes, tremors, seizures or required hospitalization for alcohol withdrawal management.  Symptoms usually resolve within 5 days.  -Alcohol consumption has been on again off again for the last several months.  -Started seeing Amy rose Deeann Fare at [employer] [facility] [employer] health counselor       Review of Systems   Constitutional:  Negative for chills, fever and malaise/fatigue.   HENT: Negative.  Negative for ear pain and hearing loss.    Eyes:  Negative for blurred vision and double vision.   Respiratory: Negative.      Cardiovascular: Negative.    Gastrointestinal: Negative.  Negative for abdominal pain, constipation, diarrhea, heartburn, nausea and vomiting.   Skin: Negative.    Neurological:  Negative for headaches.   Psychiatric/Behavioral:  The patient is not nervous/anxious.         Medications <redacted file path> and  Allergies <redacted file path>     Current Outpatient Medications   Medication Instructions   . diazePAM  (VALIUM ) 5 MG Oral tablet Take two pills four times a day for one day then one pill four times a day for the next two days   . lactobacillus rhamnosus, GG, 10 billion cell Oral capsule 1 capsule, Oral, DAILY   . losartan (COZAAR) 50 mg, Oral, Daily   . metoprolol  SUCCINATE 100 MG Oral 24 hr EXT REL tab TAKE 1 TABLET BY MOUTH EVERY DAY FOR BLOOD PRESSURE   . sertraline  50 MG Oral tablet TAKE 1 AND HALF TABLETS BY MOUTH DAILY      No Known Allergies    Objective:     Vitals  Modify Vitals <redacted file path>  height is 5\' 7"  (1.702 m) and weight is 237 lb 9.6 oz (107.8 kg). His temporal temperature is 36.6 C (97.9 F). His blood pressure is 154/106 (abnormal) and his pulse is 80. His oxygen saturation is 97%.  Body mass index is 37.21 kg/m.       Physical Exam  Vitals reviewed.   Constitutional:       General: He is not in acute distress.     Appearance: He is normal weight. He is not ill-appearing or toxic-appearing.   Cardiovascular:      Rate and Rhythm: Normal rate and regular rhythm.      Pulses: Normal pulses.      Heart sounds: No murmur heard.  Pulmonary:      Effort: Pulmonary effort is normal. No respiratory distress.      Breath sounds: Normal breath sounds. No wheezing, rhonchi or rales.   Abdominal:      Palpations: Abdomen is soft.   Musculoskeletal:         General: Normal range of motion.      Cervical back: Normal range of motion.   Lymphadenopathy:      Cervical: No cervical adenopathy.   Skin:     General: Skin is warm.      Capillary Refill: Capillary refill takes less than 2 seconds.    Neurological:      Mental Status: He is alert. Mental status is at baseline.   Psychiatric:         Behavior: Behavior normal.         Thought Content: Thought content normal.                 Assessment/Plan <redacted file path>   Clinical References <redacted file path>*   Pt Intr & Followup <redacted file path>*   Wrap Up <redacted file path>*     Issues Addressed/ Plan <redacted file path>     1. Hypertension, unspecified type  Hypertension is currently uncontrolled on current medication regimen.  He has been compliant with taking his medications.  Possibly some degree of whitecoat hypertension.    -Encouraged home blood pressure monitoring. Will record values and bring in to next visit.   -Advised he obtain labs as ordered    Reviewed lifestyle changes for blood pressure reduction:  -Weight reduction  -DASH diet: consume diet rich in fruits, vegetables, with reduced saturated and total fats  -Low sodium diet, no more than 2.4g per day  -Physical activity: regimen of moderate intensity physical activity at least 52min/day five times per week  -Alcohol reduction  -Limited use of NSAIDs    2. Alcohol abuse  Xzayvier has a history of alcohol consumption going back 20 years and has been trying to reduce/stop without success.  He is very mild symptoms of alcohol withdrawal (mild sweating, irritation) now 4 days since his last drink.  No history of severe withdrawal symptoms or seizures.  Reviewed that if his symptoms or become more severe he would seek evaluation in the emergency department.  -Provided list of resources to help with alcohol counseling and assistance with cessation.    3. GAD (generalized anxiety disorder)  Symptoms recently well-controlled on sertraline  50 mg.      4. Colon cancer screening  He had a prior colonoscopy advised to follow-up in 5 years.  Due now.  Will refer to his previous gastroenterologist.  - Ambulatory Referral to Gastroenterology; Future           Follow up  recommended <redacted  file path> Return in about 3 weeks (around 11/12/2023), or if symptoms worsen or fail to improve, for Annual physical/BP check.     Joie Narrow, FNP-C

## 2023-12-06 LAB — UNMAPPED LAB RESULTS
Basophil # (HT): 0.1 10 3/uL (ref 0.0–0.2)
Basophil % (HT): 1 % (ref 0–2)
Eosinophil # (HT): 0.3 10 3/uL (ref 0.0–0.5)
Eosinophil % (HT): 4 % (ref 0–7)
Hematocrit (HT): 45 % (ref 40–52)
Hemoglobin (HGB) (HT): 15.3 g/dL (ref 13.0–17.5)
Lymphocyte # (HT): 1.5 10 3/uL (ref 0.9–3.8)
Lymphocyte % (HT): 20 % (ref 17–44)
MCHC (HT): 33.8 g/dL (ref 32.0–36.0)
MCV (HT): 88.6 fL (ref 81.0–99.0)
Mean Corpuscular Hemoglobin (MCH) (HT): 30 pg (ref 26.0–34.0)
Monocyte # (HT): 0.7 10 3/uL (ref 0.2–1.0)
Monocyte % (HT): 9 % (ref 4–12)
Neutrophil # (HT): 4.9 10 3/uL (ref 1.5–7.7)
Platelets (HT): 330 10 3/uL (ref 150–450)
RBC (HT): 5.1 10 6/uL (ref 4.20–5.90)
RDW (HT): 12.2 % (ref 11.5–15.0)
Seg Neut % (HT): 66 % (ref 40–75)
WBC (HT): 7.5 10 3/uL (ref 4.0–10.8)

## 2024-02-19 NOTE — Progress Notes (Signed)
 Subjective      Name Victor Madden MRN [id number]    DOB Sep 02, 1966 AGE/SEX 58 y.o./ male   Preferred Name [facility]  Pronouns       Chief Complaint Chief Complaint   Patient presents with   . Follow-up     Bp check follow up- pt normally drinks daily, hasn't had a drink in 3 days, pt believes that could have to do with high bp        Visit History for  02/19/2024   Problem List <redacted file path>* Health Maintenance <redacted file path>* Results/Data <redacted file path>*  Review Flowsheets <redacted file path>*   History of Present Illness  HPI   58 yo male w/ history of HTN, HLD, prediabetes and anxiety/depression here today for follow up. He continues to work on cutting back his alcohol intake. He goes back and forth not drinking for 1-2 weeks and then slipping back into it for a few days. His last drink was 3 days ago. No symptoms of alcohol withdrawal aside from mild anxiety.    Review of Systems   Constitutional:  Negative for chills and fever.   HENT:  Negative for sore throat.    Respiratory:  Negative for cough and shortness of breath.    Cardiovascular:  Negative for chest pain and palpitations.   Gastrointestinal:  Negative for abdominal pain, diarrhea, nausea and vomiting.   Musculoskeletal:  Negative for back pain.   Skin:  Negative for rash.   Neurological:  Negative for dizziness and headaches.   Psychiatric/Behavioral:  The patient is not nervous/anxious.      Medications <redacted file path> and  Allergies <redacted file path>     Current Outpatient Medications   Medication Instructions   . cholecalciferol, Vitamin D3, 50 mcg (2,000 unit) Oral Cap 1 capsule, Oral, DAILY   . diazePAM  (VALIUM ) 5 MG Oral tablet Take two pills four times a day for one day then one pill four times a day for the next two days   . lactobacillus rhamnosus, GG, 10 billion cell Oral capsule 1 capsule, DAILY   . losartan (COZAAR) 50 mg, Oral, Daily, PLEASE SCHEDULE FO APPT   . METOPROLOL  SUCCINATE 100 MG Oral 24 hr EXT REL tab TAKE  1 TABLET BY MOUTH EVERY DAY FOR BLOOD PRESSURE   . MOUNJARO 2.5 mg, Subcutaneous, EVERY 7 DAYS   . sertraline  (ZOLOFT ) 100 mg, Oral, DAILY      No Known Allergies    Objective:     Vitals  Modify Vitals <redacted file path>  weight is 238 lb (108 kg). His temporal temperature is 36.5 C (97.7 F). His blood pressure is 130/94 (abnormal) and his pulse is 64. His oxygen saturation is 98%.  Body mass index is 37.28 kg/m.       Physical Exam  Vitals and nursing note reviewed.   Constitutional:       General: He is not in acute distress.  Eyes:      Conjunctiva/sclera: Conjunctivae normal.   Cardiovascular:      Rate and Rhythm: Normal rate and regular rhythm.   Pulmonary:      Effort: Pulmonary effort is normal.      Breath sounds: Normal breath sounds.   Musculoskeletal:      Cervical back: Neck supple.   Skin:     General: Skin is warm and dry.   Neurological:      Mental Status: He is alert.      Gait:  Gait is intact.   Psychiatric:         Mood and Affect: Mood normal.         Behavior: Behavior normal.         Thought Content: Thought content does not include suicidal ideation.              Assessment/Plan <redacted file path>   Clinical References <redacted file path>*   Pt Intr & Followup <redacted file path>*   Wrap Up <redacted file path>*     Issues Addressed/ Plan <redacted file path>    Diagnosis Plan   1. Hypertension, unspecified type  BP slightly elevated despite compliance with losartan 50mg  daily and metoprolol  succinate 100mg  daily. After discussion he would like to keep a home BP log and return in 3 weeks for BP check prior to adjusting his medications.      2. Hyperlipidemia, unspecified hyperlipidemia type  Recheck fasting lipid panel.    Lipid Panel reflex to Direct LDL if trig>400 and <1201      3. Obesity (BMI 35.0-39.9 without comorbidity)  He has struggled with weight loss for some time and would like to try GLP-1 agonist therapy. He has not had success thus far with diet and  exercise.    tirzepatide (MOUNJARO) 2.5 mg/0.5 mL SubQ PnIj      4. Prediabetes  tirzepatide (MOUNJARO) 2.5 mg/0.5 mL SubQ PnIj    Hemoglobin A1C      5. GAD (generalized anxiety disorder)  Increase sertraline  to 100mg  daily.     sertraline  (ZOLOFT ) 100 MG Oral tablet             Follow up  recommended <redacted file path> Return in about 3 weeks (around 03/11/2024) for nurse visit and office visit in 6 weeks.     Marcela Senters, MD

## 2024-03-06 ENCOUNTER — Ambulatory Visit: Payer: PRIVATE HEALTH INSURANCE | Attending: Surgery | Admitting: Surgical

## 2024-03-06 ENCOUNTER — Other Ambulatory Visit: Payer: Self-pay

## 2024-03-06 ENCOUNTER — Encounter: Payer: Self-pay | Admitting: Surgery

## 2024-03-06 VITALS — BP 156/96 | HR 83 | Temp 98.0°F | Wt 238.5 lb

## 2024-03-06 DIAGNOSIS — K429 Umbilical hernia without obstruction or gangrene: Secondary | ICD-10-CM | POA: Insufficient documentation

## 2024-03-06 NOTE — Invasive Procedure Plan of Care (Signed)
 CONSENT FOR MEDICAL  OR SURGICAL PROCEDURE                            Patient Name: Victor Madden  [facility] [address]                                                              DOB: January 04, 1966         Please read this form or have someone read it to you.   It's important to understand all parts of this form. If something isn't clear, ask us  to explain.   When you sign it, that means you understand the form and give us  permission to do this surgery or procedure.     I agree for Risolo, Annette Killings, MD along with any assistants* they may choose, to treat the following condition(s): Incarcerated periumbilical hernia    By doing this surgery or procedure on me: Incarcerated periumbilical hernia repair with mesh    This is also known as: Incarcerated periumbilical herniorrhaphy with mesh   Laterality: Not applicable     *if you'd like a list of the assistants, please ask. We can give that to you.    1. The care provider has explained my condition to me. They have told me how the procedure can help me. They have told me about other ways of treating my condition. I understand the care provider cannot guarantee the result of the procedure. If I don't have this procedure, my other choices are: Observation    2. The care provider has told me the risks (problems that can happen) of the procedure. I understand there may be unwanted results. The risks that are related to this procedure include: Infection (including infection of the prosthetic mesh with subsequent need for removal of that mesh), bleeding, recurrence, chronic pain, seroma, poor cosmesis including loss of umbilicus, bowel injury, non resolution of symptoms, the need for anesthesia, along with the unlikely risks of perioperative myocardial infarction, stroke, pulmonary and/or renal problems and even death.    3. I understand that during the procedure, my care provider may find a condition that we didn't know about before the treatment started. Therefore, I agree  that my care provider can perform any other treatment which they think is necessary and available.    4. I give permission to the hospital and/or its departments to examine and keep tissue, blood, body parts, fluids or materials removed from my body during the procedure(s) to aid in diagnosis and treatment, after which they may be used for scientific research or teaching by appropriate persons. If these materials are used for science or teaching, my identity will be protected. I will no longer own or have any rights to these materials regardless of how they may be used.    5. My care provider might want a representative from a medical device company to be there during my procedure. I understand that person works for:          The ways they might help my care provider during my procedure include:            6. Here are my decisions about receiving blood, blood products, or tissues. I understand my decisions cover the  time before, during and after my procedure, my treatment, and my time in the hospital. After my procedure, if my condition changes a lot, my care provider will talk with me again about receiving blood or blood products. At that time, my care provider might need me to review and sign another consent form, about getting or refusing blood.    I understand that the blood is from the community blood supply. Volunteers donated the blood, the volunteers were screened for health problems. The blood was examined with very sensitive and accurate tests to look for hepatitis, HIV/AIDS, and other diseases. Before I receive blood, it is tested again to make sure it is the correct type.    My chances of getting a sickness from blood products are small. But no transfusion is 100% safe. I understand that my care provider feels the good I will receive from the blood is greater than the chances of something going wrong. My care provider has answered my questions about blood products.      My decision  about blood or  blood  products   Yes        My decision   about tissue  Implants     N/A          I understand this  form.    My care provider  or his/her  assistants have  explained:   What I am having done and why I need it.  What other choices I can make instead of having this done.  The benefits and possible risks (problems) to me of having this done.  The benefits and possible risks (problems) to me of receiving transplants, blood, or blood products.  There is no guarantee of the results.  The care provider may not stay with me the entire time that I am in the operating or procedure room.  My provider has explained how this may affect my procedure. My provider has answered my  questions about this.         I give my  permission for  this surgery or  procedure.            _________________________________________                                     My signature  (or parent or other person authorized to sign for you, if you are unable to sign for yourself or if you are under 23 years old)             _____________     Date      (MM/DD/YYYY)           _______    Time      Electronic Signatures will display at the bottom of the consent form.    Care provider's statement: I have discussed the planned procedure, including the possibility for transfusion of blood  products or receipt of tissue as necessary; expected benefits; the possible complications and risks; and possible alternatives  and their benefits and risks with the patients or the patient's surrogate. In my opinion, the patient or the patient's surrogate  understands the proposed procedure, its risks, benefits and alternatives.              Electronically signed by: Charlyne Cools, PA  03/06/2024         Date        12:21 PM        Time

## 2024-03-06 NOTE — Progress Notes (Signed)
 Chief Complaint:   Chief Complaint   Patient presents with    Follow-up    Hernia       HPI: It was my pleasure to see Victor Madden, a 58 y.o. male returning to the office today.  Please refer to our previous clinic notes from 03/08/23 and 06/04/23 for complete detail regarding his incarcerated periumbilical hernia.  Since our last visit, Victor Madden reports that he has established himself with a primary care physician and has been started on blood pressure medications.  He feels that his hernia has increased in size and he is interested in pursuing surgery at this time.  He does not have pain, but describes an uncomfortable awareness at his umbilicus, specifically when he laughs.  He is not able to push the bulge back him.  He denies any nausea, and admits that his bowel movements fluctuate between normal and diarrhea.  He does not notice any change in the overlying skin.      He has been working on cutting down his alcohol intake.  He does not drink daily anymore, but will have 4 beers 3 times per week.  He does feel shaky and sweaty on days he does not drink alcohol.      Past Medical History:  Past Medical History:   Diagnosis Date    Anxiety     Depression     Hypertension     Substance abuse        Problem List:  Patient Active Problem List   Diagnosis Code    Anxiety F41.1    Hypertension I10    Hyperlipidemia E78.5    Depression F32.89    Attention Deficit Disorder Without Hyperactivity F90.9    Warts B07.9    Alcohol abuse F10.10    Periumbilical hernia, incarcerated K42.9       Surgical History:  Past Surgical History:   Procedure Laterality Date    KNEE SURGERY  1979    Knee Surgery Conversion Data        Social History:  Social History[1]      Family History:  Family History   Problem Relation Age of Onset    High Blood Pressure Mother     Skin Cancer Mother     High cholesterol Father     Conversion Other         [id number]^VWUJWJXB Cancer^V16.42^Active^    Conversion Other          [id number]^Active^    Anesthesia problems Neg Hx     Clotting disorder Neg Hx        Medications:  Current Outpatient Medications   Medication    losartan 50 mg tablet    VITAMIN D3 50 MCG (2000 UT) capsule    metoprolol  succinate ER (TOPROL -XL) 50 mg 24 hr tablet    sertraline  (ZOLOFT ) 50 MG tablet     No current facility-administered medications for this visit.       Allergies:  Allergies[2]    Review of Systems:  Constitutional: Pt has been in their normal state of health. Denies any fevers/chills, night sweats, unintentional change in weight.  He has never been diagnosed with OSA.   Eyes: Denies any blurry vision or yellowing of sclera.  Ears, Nose, Mouth, Throat: Denies any runny nose, sore throat, or ear pain.   CV: Denies chest pain, DOE, or palpitations.  Respiratory: Denies any shortness of breath, wheezing or cough.  GI: Denies melanotic stool, BRBPR, constipation or diarrhea.  Last colonoscopy  was in 2019 and benign polypectomy was performed.   GU: Denies any dysuria, hematuria or tea colored urine.  Musculoskeletal: Denies any trauma or significant arthritis.  Integumentary: Denies any rash, abrasion or burn.  Hematologic/Lymphatic: Denies any history of blood clot or bleeding disorder. Denies any lymphadenopathy.    All other systems negative.    Physical Exam:  BP (!) 156/96   Pulse 83   Temp 36.7 C (98 F)   Wt 108.2 kg (238 lb 8 oz)   SpO2 99%   BMI 37.35 kg/m     Constitutional: Awake, alert, NAD. The patient is here alone.  Neck: No carotid bruit or palpable cervical lymphadenopathy appreciated bilaterally.  Eyes: Normal conjunctivae, sclera anicteric.   Ears, Nose, Mouth, Throat: Normal hearing. Mucus membranes moist. No pharyngeal erythema.  CV: RRR, no murmur appreciated.  Respiratory: Non-labored respirations. Lungs CTA bilaterally.  GI: Soft, ND, NT. No hepatosplenomegaly, or abdominal masses appreciated.  Just above his umbilicus, there is a moderate to large sized  hernia that is not completely reducible and is mildly tender on reduction attempts.  I am unable to appreciate the discrete fascial edges.  He has a moderate rectus diastasis and I do not appreciate any other fascial defects within this.   GU: No suprapubic or CVA tenderness.  Musculoskeletal: Bilateral calves soft and NT.  Skin: Warm, dry, pink. No rash, abrasion or breakdown.    Laboratory Data/Imaging:  N/a      Assessment and Plan:  1. Periumbilical hernia, incarcerated            I had a long discussion with the patient here in the office today, explaining the concept of an umbilical hernia.  I think the patient would benefit from repair of this enlarging incarcerated periumbilical hernia.  I have explained the procedure carefully along with the risks and benefits, including but not limited to infection (including infection of the prosthetic mesh with subsequent need for removal of that mesh), bleeding, recurrence, chronic pain, seroma, poor cosmesis including loss of umbilicus, bowel injury, non resolution of symptoms, the need for anesthesia, along with the unlikely risks of perioperative myocardial infarction, stroke, pulmonary and/or renal problems and even death.  Information was given, informed consent was obtained and questions were answered.  The patient understands that their conscious choice to actively drink alcohol greatly increases all of the above mentioned peri-operative complications.   We will do this procedure soon.       Charlyne Cools, [place]  03/06/2024 12:20 PM           [1]   Social History  Socioeconomic History    Marital status: Married   Occupational History    Occupation: Therapist, occupational: [employer]   Tobacco Use    Smoking status: Never     Passive exposure: Past    Smokeless tobacco: Never   Substance and Sexual Activity    Alcohol use: Yes     Alcohol/week: 14.0 standard drinks of alcohol     Types: 14 Cans of beer per week     Comment: 3-4 beers daily    Drug use: Yes      Frequency: 3.0 times per week     Types: Marijuana     Comment: edibles for sleeping   Social History Narrative    Lives at home with his spouse, Victor Madden.   [2]   Allergies  Allergen Reactions    Environmental Allergies Other (See Comments)  Congestion      No Known Drug Allergy      Created by Conversion - 0;

## 2024-03-09 ENCOUNTER — Telehealth: Payer: Self-pay | Admitting: Surgery

## 2024-03-09 NOTE — Telephone Encounter (Signed)
 Called patient to discuss scheduling procedure.

## 2024-05-22 ENCOUNTER — Ambulatory Visit: Payer: PRIVATE HEALTH INSURANCE | Admitting: Surgery

## 2024-05-29 ENCOUNTER — Ambulatory Visit: Payer: PRIVATE HEALTH INSURANCE | Admitting: Surgery

## 2024-06-03 DIAGNOSIS — K429 Umbilical hernia without obstruction or gangrene: Secondary | ICD-10-CM | POA: Insufficient documentation

## 2024-06-05 ENCOUNTER — Telehealth: Payer: Self-pay

## 2024-06-05 ENCOUNTER — Other Ambulatory Visit: Payer: Self-pay

## 2024-06-05 ENCOUNTER — Ambulatory Visit: Payer: PRIVATE HEALTH INSURANCE | Attending: Surgery | Admitting: Surgical

## 2024-06-05 ENCOUNTER — Encounter: Payer: Self-pay | Admitting: Surgery

## 2024-06-05 VITALS — BP 130/90 | HR 58 | Temp 98.2°F | Ht 67.0 in | Wt 233.0 lb

## 2024-06-05 DIAGNOSIS — K429 Umbilical hernia without obstruction or gangrene: Secondary | ICD-10-CM

## 2024-06-05 NOTE — Telephone Encounter (Signed)
 Called pt to reschedule procedure to December per their request and schedule new pre op appt. LVM.

## 2024-06-05 NOTE — Progress Notes (Signed)
 Patient was scheduled for repair of incarcerated periumbilical hernia on 06/10/24.  He presents to clinic this morning for a preoperative visit.  He was recently promoted at work and has to be traveling over the next few weeks, therefore needs to cancel surgery for next week.  In regards to his hernia, he feels that it may be slightly bigger in size, but is not experiencing any significant pain.  He has been working on decreasing his alcohol intake.  Vitals:  06/05/24 0848 06/05/24 0917 BP: (!) 148/100 130/90 Pulse: 58  Temp: 36.8 C (98.2 F)  SpO2: 99%  Weight: 105.7 kg (233 lb)  Height: 1.702 m (5' 7)  General: pleasant male in no acute distress. Abdomen: moderate to large periumbilical hernia that is not completely reducible.  He is non-tender on reduction attempts and there are no overlying skin changes.  There is a moderate rectus diastasis.  Assessment/Plan:Tien has an incarcerated periumbilical hernia that remains stable in size and symptoms.  Given his new change in job and upcoming travel, we will postpone his surgery until December.  We discussed incarceration/strangulation and the signs of this.  He verbalizes understanding.  We will see him again prior to surgery to recheck his blood pressure.  Dorothyann Katz, [place]

## 2024-06-10 ENCOUNTER — Ambulatory Visit: Admission: RE | Admit: 2024-06-10 | Payer: PRIVATE HEALTH INSURANCE | Source: Ambulatory Visit | Admitting: Surgery

## 2024-06-10 ENCOUNTER — Encounter: Admission: RE | Payer: Self-pay | Source: Ambulatory Visit

## 2024-06-10 DIAGNOSIS — K429 Umbilical hernia without obstruction or gangrene: Secondary | ICD-10-CM | POA: Insufficient documentation

## 2024-06-10 SURGERY — REPAIR, HERNIA, UMBILICAL
Anesthesia: General | Site: Abdomen

## 2024-06-17 ENCOUNTER — Ambulatory Visit: Payer: PRIVATE HEALTH INSURANCE | Admitting: Surgery

## 2024-06-24 ENCOUNTER — Ambulatory Visit: Payer: PRIVATE HEALTH INSURANCE | Admitting: Surgery

## 2024-08-24 ENCOUNTER — Ambulatory Visit: Payer: PRIVATE HEALTH INSURANCE | Admitting: Surgery

## 2024-09-09 ENCOUNTER — Ambulatory Visit: Admission: RE | Admit: 2024-09-09 | Payer: PRIVATE HEALTH INSURANCE | Source: Ambulatory Visit | Admitting: Surgery

## 2024-09-09 ENCOUNTER — Encounter: Admission: RE | Payer: Self-pay | Source: Ambulatory Visit

## 2024-09-09 SURGERY — REPAIR, HERNIA, UMBILICAL
Site: Abdomen

## 2024-09-11 ENCOUNTER — Ambulatory Visit: Payer: PRIVATE HEALTH INSURANCE | Admitting: Surgery

## 2024-09-11 ENCOUNTER — Other Ambulatory Visit: Payer: Self-pay

## 2024-09-11 ENCOUNTER — Encounter: Payer: Self-pay | Admitting: Surgery

## 2024-09-11 VITALS — BP 132/92 | HR 57 | Temp 97.7°F | Wt 234.8 lb

## 2024-09-11 DIAGNOSIS — F411 Generalized anxiety disorder: Secondary | ICD-10-CM

## 2024-09-11 DIAGNOSIS — I10 Essential (primary) hypertension: Secondary | ICD-10-CM

## 2024-09-11 DIAGNOSIS — K429 Umbilical hernia without obstruction or gangrene: Secondary | ICD-10-CM

## 2024-09-11 NOTE — Progress Notes (Signed)
 Chief Complaint: Incarcerated umbilical herniaChief Complaint Patient presents with  Pre-op Exam   Pre Op Umb Hernia 10/14/24 HPI: It was my pleasure to see Victor Madden, a 58 y.o. male returning to the office today.  Please see our previous four clinic notes from June/September of 2024 and June/September of this year regarding his known periumbilical hernia.  He now has an incarcerated, large, periumbilical hernia and is scheduled to have this fixed on October 14, 2024.  He is here for his preoperative evaluation.  He denies any significant changes in his health since we saw him last.Past Medical History:Past Medical History[1]Problem List:Patient Active Problem List Diagnosis Code  Anxiety state F41.1  Essential hypertension I10  Hyperlipidemia E78.5  Depression F32.89  Attention Deficit Disorder Without Hyperactivity F90.9  Warts B07.9  Alcohol abuse F10.10  Periumbilical hernia, incarcerated K42.9 Surgical History:Past Surgical History[2]Social History:Social History[3]Family History:Family History[4]Medications:Current Outpatient Medications Medication  ascorbic acid 500 mg tablet  losartan 50 mg tablet  metoprolol  succinate ER (TOPROL -XL) 50 mg 24 hr tablet  sertraline  (ZOLOFT ) 50 MG tablet  sildenafil (VIAGRA) 50 mg tablet  traZODone 50 mg tablet  VITAMIN D3 50 MCG (2000 UT) capsule No current facility-administered medications for this visit. Allergies:Allergies[5]Review of Systems:Constitutional: Pt has been in their normal state of health. Denies any fevers/chills, night sweats, unintentional change in weight.Eyes: Denies any blurry vision or yellowing of sclera.Ears, Nose, Mouth, Throat: Denies any runny nose, sore throat, or ear pain. CV: Denies chest pain, DOE, or palpitations.Respiratory: Denies any shortness of breath, wheezing or cough.GI: Denies melanotic stool, BRBPR,  constipation or diarrhea.  His last colonoscopy was in 2019 and a benign polypectomy was performedGU: Denies any dysuria, hematuria or tea colored urine.Musculoskeletal: Denies any trauma or significant arthritis.Integumentary: Denies any rash, abrasion or burn.Hematologic/Lymphatic: Denies any history of blood clot or bleeding disorder. Denies any lymphadenopathy.All other systems negative.Physical Exam:BP (!) 132/92   Pulse 57   Temp 36.5 C (97.7 F) (Temporal)   Wt 106.5 kg (234 lb 12.8 oz)   SpO2 99%   BMI 36.77 kg/m Constitutional: Awake, alert, NAD. The patient is here alone.Neck: No carotid bruit or palpable cervical lymphadenopathy appreciated bilaterally.Eyes: Normal conjunctivae, sclera anicteric. Ears, Nose, Mouth, Throat: Normal hearing. Mucus membranes moist. No pharyngeal erythema.CV: RRR, no murmur appreciated.Respiratory: Non-labored respirations. Lungs CTA bilaterally.GI: Soft, ND, NT. No hepatosplenomegaly, or abdominal masses appreciated.  There is a moderate/large, incompletely reducible, periumbilical hernia with an impressive rectus diastases above this.  The fascial defect is not able to be appreciatedGU: No suprapubic or CVA tenderness.Musculoskeletal: Bilateral calves soft and NT.Skin: Warm, dry, pink. No rash, abrasion or breakdown.Laboratory Data/Imaging:ntains abnormal data Hemoglobin A1COrder: [id number] ComponentRef Range & Units 4 mo ago HEMOGLOBIN A1C4.2 - 5.6 % 5.8 High  Comment: Per ADA 2018 Guidelines, Hgb A1c should be interpreted as follows:Normal.......SABRAless than 5.7%Prediabetes.......5.7 - 6.4%Diabetes....6.5% or greater Resulting Agency [facility] Assessment and Plan:1. Periumbilical hernia, incarcerated  Case Request Operating Room: REPAIR, HERNIA, UMBILICAL (incarcerated) Add on case? No; Clinical trial? No; Case Priority: Normal; Complexity: Intermediate; Okay to Schedule at Any AMB Location:  No; Anesthesia Pre-Operative Assessment? No; Pre-Surg...  2. Essential hypertension    3. Anxiety state    I had another long discussion with the patient here in the office today, re-explaining the concept of an umbilical hernia.  I still think the patient would benefit from repair of this hernia.I have re-explained the procedure carefully along with the risks and benefits, including but not limited to infection (including infection of the  prosthetic mesh with subsequent need for removal of that mesh), bleeding, recurrence, chronic pain, seroma, poor cosmesis including loss of umbilicus, bowel injury, non resolution of symptoms, the need for anesthesia, along with the unlikely risks of perioperative myocardial infarction, stroke, pulmonary and/or renal problems and even death.Information was given, informed consent was obtained and questions were answered.  We will do this procedure soon. Victor Madden P Victor Madden, MD12/08/2024 10:52 AM [1] Past Medical History:Diagnosis Date  Anxiety   Depression   Hypertension   Substance abuse  [2] Past Surgical History:Procedure Laterality Date  KNEE SURGERY  1979  Knee Surgery Conversion Data  [3] Social HistorySocioeconomic History  Marital status: Married Occupational History  Occupation: Editor, Commissioning: [employer] Tobacco Use  Smoking status: Never   Passive exposure: Past  Smokeless tobacco: Never Vaping Use  Vaping status: Never Used Substance and Sexual Activity  Alcohol use: Yes   Alcohol/week: 14.0 standard drinks of alcohol   Types: 14 Cans of beer per week   Comment: 3-4 beers daily  Drug use: Yes   Frequency: 7.0 times per week   Types: Marijuana   Comment: edibles for sleeping Social History Narrative  Lives at home with his spouse, Victor Madden. [4] Family HistoryProblem Relation Name Age of Onset  High Blood Pressure Mother    Skin Cancer Mother    High  cholesterol Father    Conversion Other        [id number]^Emndujuz Cancer^V16.42^Active^  Conversion Other        [id number]^Hypertension^401.9^Active^  Anesthesia problems Neg Hx    Clotting disorder Neg Hx   [5] AllergiesAllergen Reactions  Environmental Allergies Other (See Comments)   Congestion  No Known Drug Allergy    Created by Conversion - 0;

## 2024-09-11 NOTE — Invasive Procedure Plan of Care (Signed)
 CONSENT FOR MEDICALOR SURGICAL PROCEDURE                            Patient Name: Victor Madden 89 MR                                                              DOB: 12/17/1965     Please read this form or have someone read it to you. It's important to understand all parts of this form. If something isn't clear, ask us  to explain. When you sign it, that means you understand the form and give us  permission to do this surgery or procedure. I agree for Shaelyn Decarli, Norleen SQUIBB, MD along with any assistants* they may choose, to treat the following condition(s): Incarcerated periumbilical hernia  By doing this surgery or procedure on me: Incarcerated periumbilical hernia repair with mesh  This is also known as: Incarcerated periumbilical herniorrhaphy  Laterality: Not applicable *if you'd like a list of the assistants, please ask. We can give that to you.1. The care provider has explained my condition to me. They have told me how the procedure can help me. They have told me about other ways of treating my condition. I understand the care provider cannot guarantee the result of the procedure. If I don't have this procedure, my other choices are: Observation2. The care provider has told me the risks (problems that can happen) of the procedure. I understand there may be unwanted results. The risks that are related to this procedure include: Infection (including infection of the prosthetic mesh with subsequent need for removal of that mesh), bleeding, recurrence, chronic pain, seroma, poor cosmesis including loss of umbilicus, bowel injury, non resolution of symptoms, the need for anesthesia, along with the unlikely risks of perioperative myocardial infarction, stroke, pulmonary and/or renal problems and even death.3. I understand that during the procedure, my care provider may find a condition that we didn't know about before the treatment started. Therefore, I agree that my  care provider can perform any other treatment which they think is necessary and available.4. I give permission to the hospital and/or its departments to examine and keep tissue, blood, body parts, fluids or materials removed from my body during the procedure(s) to aid in diagnosis and treatment, after which they may be used for scientific research or teaching by appropriate persons. If these materials are used for science or teaching, my identity will be protected. I will no longer own or have any rights to these materials regardless of how they may be used.5. My care provider might want a representative from a medical device company to be there during my procedure. I understand that person works for:        The ways they might help my care provider during my procedure include:      6. Here are my decisions about receiving blood, blood products, or tissues. I understand my decisions cover the time before, during and after my procedure, my treatment, and my time in the hospital. After my procedure, if my condition changes a lot, my care provider will talk with me again about receiving blood or blood products. At that time, my care provider might need me to review and sign another consent form, about  getting or refusing blood.I understand that the blood is from the community blood supply. Volunteers donated the blood, the volunteers were screened for health problems. The blood was examined with very sensitive and accurate tests to look for hepatitis, HIV/AIDS, and other diseases. Before I receive blood, it is tested again to make sure it is the correct type.My chances of getting a sickness from blood products are small. But no transfusion is 100% safe. I understand that my care provider feels the good I will receive from the blood is greater than the chances of something going wrong. My care provider has answered my questions about blood products.My decisionabout blood orblood  products Yes  My decision about tissueImplants N/A  I understand thisform.My care provideror his/herassistants haveexplained: What I am having done and why I need it.What other choices I can make instead of having this done.The benefits and possible risks (problems) to me of having this done.The benefits and possible risks (problems) to me of receiving transplants, blood, or blood products.There is no guarantee of the results.The care provider may not stay with me the entire time that I am in the operating or procedure room.My provider has explained how this may affect my procedure. My provider has answered myquestions about this. I give mypermission forthis surgery orprocedure.        _________________________________________                                   My signature(or parent or other person authorized to sign for you, if you are unable to sign for yourself or if you are under 35 years old)       _____________   Date    (MM/DD/YYYY)     _______  Time  Electronic Signatures will display at the bottom of the consent form.Care provider's statement: I have discussed the planned procedure, including the possibility for transfusion of bloodproducts or receipt of tissue as necessary; expected benefits; the possible complications and risks; and possible alternativesand their benefits and risks with the patients or the patient's surrogate. In my opinion, the patient or the patient's surrogateunderstands the proposed procedure, its risks, benefits and alternatives.      Electronically signed by: NORLEEN SHAUNNA ERNST, MD                                          09/11/2024       Date    10:51 AM      Time

## 2024-09-21 ENCOUNTER — Ambulatory Visit: Payer: PRIVATE HEALTH INSURANCE | Admitting: Surgery

## 2024-09-25 ENCOUNTER — Ambulatory Visit: Payer: PRIVATE HEALTH INSURANCE | Admitting: Surgery

## 2024-10-09 ENCOUNTER — Telehealth: Payer: Self-pay

## 2024-10-09 NOTE — Anesthesia Preprocedure Evaluation (Addendum)
 Anesthesia Pre-operative History and Physical for Victor Madden.CPM/PAT Summary:I have reviewed the RN anesthesia screening information and erecords for [name]. The patient is scheduled for REPAIR, HERNIA, UMBILICAL - incarcerated periumbilical with mesh on 10/14/24 with Dr. Risolo. Pertinent past medical history is significant for:BMI 36.77Periumbilical hernia- Plan for above procedure HTN- metoprolol , losartanHLDAnxiety / DepressionTHC use; edibles ETOH use- Per RN screen, does not drink everyday, but when he drinks it can be 5+ beers, given ETOH instrutions, pt states he plans to taper back and eventually abstain from drinking ~10 days prior to surgeryAccording to the anesthesia RN screening report/ patient questionaire the patient has no present complaints of chest pain. URI sx, started 1/5, productive cough with green sputum, no fever or breathing issues, has not been seen by PCP or tested Able to climb a FOS without needing to stop at the top. Discharge plan: home with wife Scheduled procedure has been canceled 4 times prior. Per surgical team on 06/04/23, surgery was postponed due to elevated BP (139/103), BMI (36.48) and ETOH use at that time. Surgery on 12/10 was postponed due to GI symptoms. Another was cancelled by the patient due to work obligations.  *Surgical team has cancelled procedure on 10/14/24. Discussed with Dr. Lavonia, due to URI symptoms with productive cough- recommend rescheduling elective surgery 4 weeks from the start of symptoms. Okay to be rescheduled after 11/02/24. PP sent to surgical team and scheduler for awareness. Writer also made the patient aware via phone call and was understanding. CPM Chart Review Only by Calla Quale, NP at 10/09/2024 9:46 AM  ROS/MED HX Not CompletedPhysical Exam Not Completed________________________________________________________________________Anes PlanAnesthesia Consent Not  Performed

## 2024-10-14 ENCOUNTER — Encounter: Admission: RE | Payer: Self-pay | Source: Ambulatory Visit

## 2024-10-14 ENCOUNTER — Ambulatory Visit: Admission: RE | Admit: 2024-10-14 | Payer: PRIVATE HEALTH INSURANCE | Source: Ambulatory Visit | Admitting: Surgery

## 2024-10-14 SURGERY — REPAIR, HERNIA, UMBILICAL
Anesthesia: General | Site: Abdomen

## 2024-10-28 ENCOUNTER — Ambulatory Visit: Payer: PRIVATE HEALTH INSURANCE | Admitting: Surgery

## 2024-11-06 ENCOUNTER — Ambulatory Visit: Payer: PRIVATE HEALTH INSURANCE | Admitting: Surgery

## 2024-11-18 ENCOUNTER — Ambulatory Visit: Admission: RE | Admit: 2024-11-18 | Payer: PRIVATE HEALTH INSURANCE | Source: Ambulatory Visit | Admitting: Surgery

## 2024-11-18 ENCOUNTER — Encounter: Admission: RE | Payer: Self-pay | Source: Ambulatory Visit

## 2024-11-18 DIAGNOSIS — K429 Umbilical hernia without obstruction or gangrene: Secondary | ICD-10-CM | POA: Insufficient documentation

## 2024-12-02 ENCOUNTER — Ambulatory Visit: Payer: PRIVATE HEALTH INSURANCE | Admitting: Surgery
